# Patient Record
Sex: Female | Born: 1975 | Race: White | Hispanic: No | Smoking: Never smoker
Health system: Southern US, Community
[De-identification: ages and names within clinical notes are randomized; demographics above are authoritative.]

## PROBLEM LIST (undated history)

## (undated) DIAGNOSIS — F419 Anxiety disorder, unspecified: Secondary | ICD-10-CM

## (undated) HISTORY — DX: Anxiety disorder, unspecified: F41.9

---

## 2009-08-15 ENCOUNTER — Ambulatory Visit (HOSPITAL_COMMUNITY): Admission: RE | Admit: 2009-08-15 | Discharge: 2009-08-15 | Payer: Self-pay | Admitting: Obstetrics and Gynecology

## 2009-08-29 ENCOUNTER — Inpatient Hospital Stay (HOSPITAL_COMMUNITY): Admission: AD | Admit: 2009-08-29 | Discharge: 2009-09-01 | Payer: Self-pay | Admitting: Obstetrics and Gynecology

## 2010-12-24 ENCOUNTER — Encounter: Payer: Self-pay | Admitting: Obstetrics and Gynecology

## 2011-03-08 LAB — CBC
HCT: 40.2 % (ref 36.0–46.0)
Hemoglobin: 11.5 g/dL — ABNORMAL LOW (ref 12.0–15.0)
MCHC: 34.1 g/dL (ref 30.0–36.0)
MCHC: 34.2 g/dL (ref 30.0–36.0)
MCV: 94.4 fL (ref 78.0–100.0)
MCV: 96.5 fL (ref 78.0–100.0)
Platelets: 265 10*3/uL (ref 150–400)
RBC: 3.49 MIL/uL — ABNORMAL LOW (ref 3.87–5.11)
RDW: 13.5 % (ref 11.5–15.5)

## 2011-03-08 LAB — HERPES SIMPLEX VIRUS CULTURE: Culture: NOT DETECTED

## 2013-09-10 ENCOUNTER — Other Ambulatory Visit: Payer: Self-pay | Admitting: Obstetrics and Gynecology

## 2013-09-10 DIAGNOSIS — N6320 Unspecified lump in the left breast, unspecified quadrant: Secondary | ICD-10-CM

## 2013-09-23 ENCOUNTER — Ambulatory Visit
Admission: RE | Admit: 2013-09-23 | Discharge: 2013-09-23 | Disposition: A | Discharge status: Home / Self Care | Payer: 59 | Source: Ambulatory Visit | Attending: Obstetrics and Gynecology | Admitting: Obstetrics and Gynecology

## 2013-09-23 DIAGNOSIS — N6320 Unspecified lump in the left breast, unspecified quadrant: Secondary | ICD-10-CM

## 2016-01-24 DIAGNOSIS — M79671 Pain in right foot: Secondary | ICD-10-CM | POA: Diagnosis not present

## 2016-01-24 DIAGNOSIS — M79672 Pain in left foot: Secondary | ICD-10-CM | POA: Diagnosis not present

## 2016-01-24 DIAGNOSIS — M722 Plantar fascial fibromatosis: Secondary | ICD-10-CM | POA: Diagnosis not present

## 2016-06-20 DIAGNOSIS — D225 Melanocytic nevi of trunk: Secondary | ICD-10-CM | POA: Diagnosis not present

## 2016-06-20 DIAGNOSIS — L820 Inflamed seborrheic keratosis: Secondary | ICD-10-CM | POA: Diagnosis not present

## 2016-06-20 DIAGNOSIS — Z1283 Encounter for screening for malignant neoplasm of skin: Secondary | ICD-10-CM | POA: Diagnosis not present

## 2016-06-20 DIAGNOSIS — D485 Neoplasm of uncertain behavior of skin: Secondary | ICD-10-CM | POA: Diagnosis not present

## 2016-06-20 DIAGNOSIS — D2239 Melanocytic nevi of other parts of face: Secondary | ICD-10-CM | POA: Diagnosis not present

## 2017-01-06 DIAGNOSIS — Z01419 Encounter for gynecological examination (general) (routine) without abnormal findings: Secondary | ICD-10-CM | POA: Diagnosis not present

## 2017-01-06 DIAGNOSIS — Z1389 Encounter for screening for other disorder: Secondary | ICD-10-CM | POA: Diagnosis not present

## 2017-01-06 DIAGNOSIS — Z130 Encounter for screening for diseases of the blood and blood-forming organs and certain disorders involving the immune mechanism: Secondary | ICD-10-CM | POA: Diagnosis not present

## 2017-01-06 DIAGNOSIS — D649 Anemia, unspecified: Secondary | ICD-10-CM | POA: Diagnosis not present

## 2017-01-06 DIAGNOSIS — Z124 Encounter for screening for malignant neoplasm of cervix: Secondary | ICD-10-CM | POA: Diagnosis not present

## 2017-01-13 ENCOUNTER — Encounter: Payer: Self-pay | Admitting: Family Medicine

## 2017-01-13 ENCOUNTER — Ambulatory Visit (INDEPENDENT_AMBULATORY_CARE_PROVIDER_SITE_OTHER): Payer: 59 | Admitting: Family Medicine

## 2017-01-13 VITALS — BP 114/72 | HR 60 | Temp 97.8°F | Ht 60.63 in | Wt 146.2 lb

## 2017-01-13 DIAGNOSIS — Z13220 Encounter for screening for lipoid disorders: Secondary | ICD-10-CM | POA: Diagnosis not present

## 2017-01-13 DIAGNOSIS — Z23 Encounter for immunization: Secondary | ICD-10-CM

## 2017-01-13 DIAGNOSIS — R059 Cough, unspecified: Secondary | ICD-10-CM

## 2017-01-13 DIAGNOSIS — Z0000 Encounter for general adult medical examination without abnormal findings: Secondary | ICD-10-CM

## 2017-01-13 DIAGNOSIS — R635 Abnormal weight gain: Secondary | ICD-10-CM

## 2017-01-13 DIAGNOSIS — R05 Cough: Secondary | ICD-10-CM

## 2017-01-13 LAB — COMPREHENSIVE METABOLIC PANEL
ALT: 13 U/L (ref 0–35)
AST: 16 U/L (ref 0–37)
Albumin: 3.6 g/dL (ref 3.5–5.2)
Alkaline Phosphatase: 75 U/L (ref 39–117)
BUN: 13 mg/dL (ref 6–23)
CALCIUM: 9.2 mg/dL (ref 8.4–10.5)
CHLORIDE: 109 meq/L (ref 96–112)
CO2: 24 meq/L (ref 19–32)
CREATININE: 0.65 mg/dL (ref 0.40–1.20)
GFR: 108.06 mL/min (ref >60.00)
Glucose, Bld: 93 mg/dL (ref 70–99)
Potassium: 4.7 mEq/L (ref 3.5–5.1)
SODIUM: 137 meq/L (ref 135–145)
Total Bilirubin: 0.3 mg/dL (ref 0.2–1.2)
Total Protein: 6.6 g/dL (ref 6.0–8.3)

## 2017-01-13 LAB — LIPID PANEL
CHOLESTEROL: 225 mg/dL — AB (ref 0–200)
HDL: 56.3 mg/dL (ref >39.00)
LDL CALC: 139 mg/dL — AB (ref 0–99)
NonHDL: 168.8
Total CHOL/HDL Ratio: 4
Triglycerides: 147 mg/dL (ref 0.0–149.0)
VLDL: 29.4 mg/dL (ref 0.0–40.0)

## 2017-01-13 LAB — TSH: TSH: 0.83 u[IU]/mL (ref 0.35–4.50)

## 2017-01-13 NOTE — Progress Notes (Signed)
Subjective:    Patient ID: Anna Vang, female    DOB: 1975/12/24, 41 y.o.   MRN: [id number]  HPI This is a 41 yo female who presents today to establish care. Sees Dr. Daiva Huge, gyn. Is married and has 2 kids. Works as a Probation officer. Has two cats and a dog. Walks for exercise. Likes to be outdoors. Children are 7,13.   Has noticed that she coughs when she is around her cats or if the air conditioner turns on or if humidity changes. Coughs a couple of times and then stops. No sputum. Cough resolves with water and cough lozenge.   Has noticed some recent weight gain, increased stress with husband's diabetes.   Pap- last week Tdap- when 42 yo, will have today Flu- annual  Eye- over due Dental- regular, Dr. Scarlett Presto Exercise- walks    Past Medical History:  Diagnosis Date  . Anxiety    Past Surgical History:  Procedure Laterality Date  . CESAREAN SECTION     Family History  Problem Relation Age of Onset  . Mental illness Mother   . Kidney disease Mother   . Hypertension Father   . Diabetes Father   . Hypertension Brother    Social History  Substance Use Topics  . Smoking status: Never Smoker  . Smokeless tobacco: Never Used  . Alcohol use 1.2 oz/week    2 Glasses of wine per week      Review of Systems  Constitutional: Negative for fatigue.  HENT: Positive for congestion (occasional).   Respiratory: Positive for cough. Negative for shortness of breath and wheezing.   Cardiovascular: Positive for leg swelling (occaional at end of work day). Negative for chest pain.  Gastrointestinal: Negative for abdominal pain, constipation, diarrhea and nausea.  Genitourinary: Negative for dyspareunia, dysuria, frequency, hematuria and menstrual problem.  Musculoskeletal: Negative for back pain and neck pain.  Neurological: Negative for headaches.  Psychiatric/Behavioral: Negative for dysphoric mood and sleep disturbance.       Objective:   Physical Exam Physical Exam    Constitutional: Oriented to person, place, and time. She appears well-developed and well-nourished.  HENT:  Head: Normocephalic and atraumatic.  Eyes: Conjunctivae are normal.  Neck: Normal range of motion. Neck supple.  Cardiovascular: Normal rate, regular rhythm and normal heart sounds.   Pulmonary/Chest: Effort normal and breath sounds normal.  Musculoskeletal: Normal range of motion.  Neurological: Alert and oriented to person, place, and time.  Skin: Skin is warm and dry.  Psychiatric: Normal mood and affect. Behavior is normal. Judgment and thought content normal.  Vitals reviewed.     BP 114/72 (BP Location: Left Arm, Patient Position: Sitting, Cuff Size: Normal)   Pulse 60   Temp 97.8 F (36.6 C) (Oral)   Ht 5' 0.63" (1.54 m)   Wt 146 lb 3.2 oz (66.3 kg)   LMP 12/28/2016   SpO2 99%   BMI 27.96 kg/m  Wt Readings from Last 3 Encounters:  01/13/17 146 lb 3.2 oz (66.3 kg)   Depression screen PHQ 2/9 01/13/2017  Decreased Interest 0  Down, Depressed, Hopeless 0  PHQ - 2 Score 0       Assessment & Plan:  1. Encounter for medical examination to establish care - Discussed and encouraged healthy lifestyle choices- adequate sleep, regular exercise, stress management and healthy food choices.   2. Cough - likely due to allergic rhinitis, suggested OTC long acting antihistamine, may need inhaled corticosteroid/albuterol if no improvement, she will let me  know - Comprehensive metabolic panel  3. Weight gain - Comprehensive metabolic panel - TSH - Lipid panel  4. Screening for lipid disorders - Lipid panel  5. Flu vaccine need - Flu Vaccine QUAD 36+ mos IM  6. Need for diphtheria-tetanus-pertussis (Tdap) vaccine - Tdap vaccine greater than or equal to 7yo IM  - follow up based on labs  Clarene Reamer, FNP-BC  [facility]  01/13/2017 4:39 PM

## 2017-01-13 NOTE — Progress Notes (Signed)
Pre visit review using our clinic review tool, if applicable. No additional management support is needed unless otherwise documented below in the visit note. 

## 2017-01-13 NOTE — Patient Instructions (Addendum)
Try an over the counter long acting antihistamine like Zyrtec or Claritin (generic is fine)  Please schedule an eye appointment

## 2017-03-06 ENCOUNTER — Ambulatory Visit (INDEPENDENT_AMBULATORY_CARE_PROVIDER_SITE_OTHER): Payer: 59 | Admitting: Physician Assistant

## 2017-03-06 ENCOUNTER — Encounter: Payer: Self-pay | Admitting: Physician Assistant

## 2017-03-06 VITALS — BP 110/78 | HR 69 | Temp 98.9°F | Ht 62.0 in | Wt 145.2 lb

## 2017-03-06 DIAGNOSIS — L03211 Cellulitis of face: Secondary | ICD-10-CM | POA: Diagnosis not present

## 2017-03-06 MED ORDER — AMOXICILLIN-POT CLAVULANATE 875-125 MG PO TABS: 1.0000 | ORAL_TABLET | Freq: Two times a day (BID) | ORAL | 0 refills | Status: AC

## 2017-03-06 MED ORDER — MUPIROCIN 2 % EX OINT: 1.0000 "application " | TOPICAL_OINTMENT | Freq: Two times a day (BID) | CUTANEOUS | 0 refills | Status: DC

## 2017-03-06 NOTE — Progress Notes (Signed)
Anna Vang is a 41 y.o. female here for a new problem.  History of Present Illness:   Chief Complaint  Patient presents with  . Rash    Bumps under left eye, red and tender to touch    HPI   On Friday, patient noticed a rash under her L eye. It started as one or two bumps but has become a little more tender with more bumps. No drainage, no painful EOM, no fevers, no changes in vision, does not wear contacts. She has tried topical salicylic acid, hydrocortisone cream and yeast infection cream without relief.  She does state that she has two cats at home and one of them is outdoor, she says that its possible that she has gotten a secondary infection from this.  PMHx, SurgHx, SocialHx, Medications, and Allergies were reviewed in the Visit Navigator and updated as appropriate.  Current Medications:   Current Outpatient Prescriptions:  .  folic acid (FOLVITE) 676 MCG tablet, Take 400 mcg by mouth daily., Disp: , Rfl:  .  Multiple Vitamins-Minerals (MULTIVITAMIN WITH MINERALS) tablet, Take 1 tablet by mouth daily., Disp: , Rfl:  .  amoxicillin-clavulanate (AUGMENTIN) 875-125 MG tablet, Take 1 tablet by mouth 2 (two) times daily., Disp: 20 tablet, Rfl: 0 .  mupirocin ointment (BACTROBAN) 2 %, Apply 1 application topically 2 (two) times daily., Disp: 22 g, Rfl: 0   Review of Systems:   Review of Systems  Constitutional: Negative for chills and fever.  Eyes: Positive for redness.       Bumps under left eye, red raised area and tender to touch  Skin: Positive for rash.       Under left eye    Vitals:   Vitals:   03/06/17 0855  BP: 110/78  Pulse: 69  Temp: 98.9 F (37.2 C)  TempSrc: Oral  SpO2: 98%  Weight: 145 lb 4 oz (65.9 kg)  Height: 5\' 2"  (1.575 m)     Body mass index is 26.57 kg/m.  Physical Exam:   Physical Exam  Constitutional: She appears well-developed. She is cooperative.  Non-toxic appearance. She does not have a sickly appearance. She does not appear ill.  No distress.  Eyes: Right eye exhibits normal extraocular motion. Left eye exhibits normal extraocular motion.  Numerous pustules under L eye with redness and swelling, no active drainage, tender to touch  Cardiovascular: Normal rate, regular rhythm, S1 normal, S2 normal, normal heart sounds and normal pulses.   No LE edema  Pulmonary/Chest: Effort normal and breath sounds normal.  Neurological: She is alert.  Nursing note and vitals reviewed.    Assessment and Plan:    Anna Vang was seen today for rash.  Diagnoses and all orders for this visit:  Cellulitis of face  Other orders -     amoxicillin-clavulanate (AUGMENTIN) 875-125 MG tablet; Take 1 tablet by mouth 2 (two) times daily. -     mupirocin ointment (BACTROBAN) 2 %; Apply 1 application topically 2 (two) times daily.    Patient also evaluated by Dr. Briscoe Deutscher. Suspect patient with likely secondary skin infection, will treat with Augmentin per orders. Topical Bactroban to area. Avoid further use of Salicylic Acid as this will possibly cause worsening irritation. Advised patient as follows: If you develop fevers, chills, worsening redness or other concerning symptoms, please seek medical attention.  . Reviewed expectations re: course of current medical issues. . Discussed self-management of symptoms. . Outlined signs and symptoms indicating need for more acute intervention. . Patient  verbalized understanding and all questions were answered. . See orders for this visit as documented in the electronic medical record. . Patient received an After-Visit Summary.   Inda Coke, PA-C

## 2017-03-06 NOTE — Patient Instructions (Signed)
It was great seeing you today!  Begin the oral antibiotic today, you may take with food as tolerated. You may apply Mupirocin to area. Avoid salicylic acid.  If you develop fevers, chills, worsening redness or other concerning symptoms, please seek medical attention.

## 2017-03-06 NOTE — Progress Notes (Signed)
Pre visit review using our clinic review tool, if applicable. No additional management support is needed unless otherwise documented below in the visit note. 

## 2017-04-29 ENCOUNTER — Telehealth: Payer: Self-pay

## 2017-04-29 NOTE — Telephone Encounter (Signed)
FYI: Spoke with patient, she no longer needs the script sent to the pharmacy due to being home now. She used medication last pm with some relief. Used cold compresses this morning with some relief as well. If sx are not better in 1 week she will call back.

## 2017-04-29 NOTE — Telephone Encounter (Signed)
Tried calling patient on Mobile phone with no answer and unable to leave a voicemail. FYI.

## 2017-04-29 NOTE — Telephone Encounter (Signed)
Please call patient on cell

## 2017-04-29 NOTE — Telephone Encounter (Signed)
Verbal okay from [facility] to send in medication. Due to the phone system I am unable to call this patient. If she happens to call office and get through then we need too obtain her pharmacy information if still needing medication.

## 2017-04-29 NOTE — Telephone Encounter (Signed)
[place] [facility] [place] Patient Name: Anna Vang Gender: Female DOB: 1976-03-31 Age: 41 Y 11 M 2 D Return Phone Number: [phone] (Primary), [phone] (Secondary) City/State/Zip: [place] Client [place] [facility] [place] Client Site [place] [facility] [place] Night Who Is Calling Patient / Member / Family / Caregiver Call Type Triage / Clinical Relationship To Patient Self Return Phone Number [phone] (Primary) Chief Complaint Skin Lesion - Moles/ Lumps/ Growths Reason for Call Symptomatic / Request for [place] states was seen for skin infection underneath eye. She's at [facility] in [place] and she has it again. Was prescribed antibiotic and antibiotic cream, but she left cream at home. They are tiny bumps with white heads. Requesting cream [facility] Dr unknown Appointment Disposition EMR Appointment Not Necessary Info pasted into Epic No Nurse Assessment Nurse: Emilio Math, RN, Estill Bamberg Date/Time Eilene Ghazi Time): 04/26/2017 8:45:48 AM Confirm and document reason for call. If symptomatic, describe symptoms. ---Caller states she was prescribed an antibiotic cream x1 month ago for infection to eye, resolved, but now is back. Small bumps with "white heads" underneath eye, R eye "puffy". Denies fever. Out of town and wants medication called in. Does the PT have any chronic conditions? (i.e. diabetes, asthma, etc.) ---No Is the patient pregnant or possibly pregnant? (Ask all females between the ages of 70-55) ---No Guidelines Guideline Title Affirmed Question Eye - Swelling MODERATE-SEVERE eyelid swelling on one side (Exception: due to a mosquito bite) Disp. Time Eilene Ghazi Time) Disposition Final User 04/26/2017 8:51:03 AM See Physician within 24 Hours Yes Emilio Math, RN, Estill Bamberg Referrals GO TO FACILITY UNDECIDED Care Advice  Given Per Guideline SEE PHYSICIAN WITHIN 24 HOURS: * IF OFFICE WILL BE OPEN: You need to be seen within the next 24 hours. Call your doctor when the office opens, and make an appointment. LOCAL COLD: Apply a cool, wet washcloth to the area for 20 minutes. CALL BACK IF: * Fever occurs * You become worse. CARE ADVICE given per Eye - Swelling (Adult) guideline. PLEASE NOTE:

## 2017-05-22 ENCOUNTER — Telehealth: Payer: Self-pay | Admitting: Emergency Medicine

## 2017-05-22 NOTE — Telephone Encounter (Signed)
Called and spoke with patient, she states that she came home and applied the medication that she previously was prescribed and it went away. She still has one bump that she will monitor. Patient states she will make appointment if needed.      PLEASE NOTE: All timestamps contained within this report are represented as [place] Standard Time. CONFIDENTIALTY NOTICE: This fax transmission is intended only for the addressee. It contains information that is legally privileged, confidential or otherwise protected from use or disclosure. If you are not the intended recipient, you are strictly prohibited from reviewing, disclosing, copying using or disseminating any of this information or taking any action in reliance on or regarding this information. If you have received this fax in error, please notify us immediately by telephone so that we can arrange for its return to [place]. Phone: [phone], Toll-Free: [phone], Fax: [phone] Page: 1 of 2 Call Id: [id number] [place] [facility] [place] Patient Name: Anna Vang Gender: Female DOB: Jul 15, 1976 Age: 41 Y 11 M 28 D Return Phone Number: [phone] (Primary), [phone] (Secondary) City/State/Zip: [place] Client [place] [facility] [place] Client Site [place] [facility] [place] Night Who Is Calling Patient / Member / Family / Caregiver Call Type Triage / Clinical Relationship To Patient Self Return Phone Number [phone] (Primary) Chief Complaint Skin Lesion - Moles/ Lumps/ Growths Reason for Call Symptomatic / Request for [place] states was seen for skin infection underneath eye. She's at [facility] in [place] and she has it again. Was prescribed antibiotic and antibiotic cream, but she left cream at home. They are tiny bumps with white heads. [facility]  unknown Appointment Disposition EMR Appointment Not Necessary Info pasted into Epic No Nurse Assessment Nurse: Emilio Math, RN, Estill Bamberg Date/Time Eilene Ghazi Time): 04/26/2017 8:45:48 AM Confirm and document reason for call. If symptomatic, describe symptoms. ---Caller states she was prescribed an antibiotic cream x1 month ago for infection to eye, resolved, but now is back. Small bumps with "white heads" underneath eye, R eye "puffy". Denies fever. Out of town and wants medication called in. Does the PT have any chronic conditions? (i.e. diabetes, asthma, etc.) ---No Is the patient pregnant or possibly pregnant? (Ask all females between the ages of 20-55) ---No Guidelines Guideline Title Affirmed Question Eye - Swelling MODERATE-SEVERE eyelid swelling on one side (Exception: due to a mosquito bite) Disp. Time Eilene Ghazi Time) Disposition Final User 04/26/2017 8:51:03 AM See Physician within 24 Hours Yes Emilio Math, RN, Estill Bamberg Referrals GO TO FACILITY UNDECIDED Care Advice Given Per Guideline SEE PHYSICIAN WITHIN 24 HOURS: * IF OFFICE WILL BE OPEN: You need to be seen within the next 24 hours. Call your doctor when the office opens, and make an appointment. LOCAL COLD: Apply a cool, wet washcloth to the area for 20 minutes. CALL BACK IF: * Fever occurs * You become worse. CARE ADVICE given per Eye - Swelling (Adult) guideline. PLEASE NOTE: All timestamps contained within this report are represented as [place] Standard Time. CONFIDENTIALTY NOTICE: This fax transmission is intended only for the addressee. It contains information that is legally privileged, confidential or otherwise protected from use or disclosure. If you are not the intended recipient, you are strictly prohibited from reviewing, disclosing, copying using or disseminating any of this information or taking any action in reliance on or regarding this information. If you have received this fax  in error, please notify us immediately by  telephone so that we can arrange for its return to [place]. Phone: [phone], Toll-Free: [phone], Fax: [phone] Page: 2 of 2 Call Id: [id number] Comments User: Richardean Sale, RN Date/Time Eilene Ghazi Time): 04/26/2017 8:51:33 AM Caller is out of town, will be seen at nearby Urgent Care.

## 2018-05-21 ENCOUNTER — Ambulatory Visit: Payer: 59 | Admitting: Family Medicine

## 2018-05-21 DIAGNOSIS — Z0289 Encounter for other administrative examinations: Secondary | ICD-10-CM

## 2018-05-21 DIAGNOSIS — H5203 Hypermetropia, bilateral: Secondary | ICD-10-CM | POA: Diagnosis not present

## 2018-05-29 DIAGNOSIS — Z1389 Encounter for screening for other disorder: Secondary | ICD-10-CM | POA: Diagnosis not present

## 2018-05-29 DIAGNOSIS — Z01419 Encounter for gynecological examination (general) (routine) without abnormal findings: Secondary | ICD-10-CM | POA: Diagnosis not present

## 2018-05-29 DIAGNOSIS — Z124 Encounter for screening for malignant neoplasm of cervix: Secondary | ICD-10-CM | POA: Diagnosis not present

## 2018-05-29 DIAGNOSIS — Z130 Encounter for screening for diseases of the blood and blood-forming organs and certain disorders involving the immune mechanism: Secondary | ICD-10-CM | POA: Diagnosis not present

## 2019-06-11 DIAGNOSIS — Z124 Encounter for screening for malignant neoplasm of cervix: Secondary | ICD-10-CM | POA: Diagnosis not present

## 2019-06-11 DIAGNOSIS — Z01419 Encounter for gynecological examination (general) (routine) without abnormal findings: Secondary | ICD-10-CM | POA: Diagnosis not present

## 2019-06-11 DIAGNOSIS — Z1231 Encounter for screening mammogram for malignant neoplasm of breast: Secondary | ICD-10-CM | POA: Diagnosis not present

## 2019-06-11 DIAGNOSIS — Z6828 Body mass index (BMI) 28.0-28.9, adult: Secondary | ICD-10-CM | POA: Diagnosis not present

## 2019-06-11 DIAGNOSIS — Z1389 Encounter for screening for other disorder: Secondary | ICD-10-CM | POA: Diagnosis not present

## 2019-06-11 DIAGNOSIS — Z130 Encounter for screening for diseases of the blood and blood-forming organs and certain disorders involving the immune mechanism: Secondary | ICD-10-CM | POA: Diagnosis not present

## 2019-06-11 DIAGNOSIS — D473 Essential (hemorrhagic) thrombocythemia: Secondary | ICD-10-CM | POA: Diagnosis not present

## 2019-09-13 DIAGNOSIS — L814 Other melanin hyperpigmentation: Secondary | ICD-10-CM | POA: Diagnosis not present

## 2019-09-13 DIAGNOSIS — D229 Melanocytic nevi, unspecified: Secondary | ICD-10-CM | POA: Diagnosis not present

## 2020-03-20 DIAGNOSIS — Z20828 Contact with and (suspected) exposure to other viral communicable diseases: Secondary | ICD-10-CM | POA: Diagnosis not present

## 2020-03-20 DIAGNOSIS — Z20822 Contact with and (suspected) exposure to covid-19: Secondary | ICD-10-CM | POA: Diagnosis not present

## 2020-03-20 DIAGNOSIS — Z03818 Encounter for observation for suspected exposure to other biological agents ruled out: Secondary | ICD-10-CM | POA: Diagnosis not present

## 2020-03-20 DIAGNOSIS — J0140 Acute pansinusitis, unspecified: Secondary | ICD-10-CM | POA: Diagnosis not present

## 2021-04-25 ENCOUNTER — Encounter (HOSPITAL_COMMUNITY): Payer: Self-pay | Admitting: Cardiology

## 2021-04-25 ENCOUNTER — Emergency Department (HOSPITAL_COMMUNITY): Payer: 59

## 2021-04-25 ENCOUNTER — Emergency Department (HOSPITAL_COMMUNITY): Payer: 59 | Admitting: Anesthesiology

## 2021-04-25 ENCOUNTER — Inpatient Hospital Stay (HOSPITAL_COMMUNITY): Payer: 59

## 2021-04-25 ENCOUNTER — Inpatient Hospital Stay (HOSPITAL_COMMUNITY)
Admission: EM | Admit: 2021-04-25 | Discharge: 2021-04-29 | DRG: 232 | Disposition: A | Discharge status: Home / Self Care | Payer: 59 | Attending: Cardiothoracic Surgery | Admitting: Cardiothoracic Surgery

## 2021-04-25 ENCOUNTER — Inpatient Hospital Stay (HOSPITAL_COMMUNITY)
Admission: EM | Disposition: A | Discharge status: Home / Self Care | Payer: Self-pay | Source: Home / Self Care | Attending: Cardiothoracic Surgery

## 2021-04-25 ENCOUNTER — Ambulatory Visit (HOSPITAL_COMMUNITY): Admit: 2021-04-25 | Payer: 59 | Admitting: Cardiovascular Disease

## 2021-04-25 ENCOUNTER — Encounter (HOSPITAL_COMMUNITY)
Admission: EM | Disposition: A | Discharge status: Home / Self Care | Payer: Self-pay | Source: Home / Self Care | Attending: Cardiothoracic Surgery

## 2021-04-25 DIAGNOSIS — E8770 Fluid overload, unspecified: Secondary | ICD-10-CM | POA: Diagnosis not present

## 2021-04-25 DIAGNOSIS — R0789 Other chest pain: Secondary | ICD-10-CM | POA: Diagnosis not present

## 2021-04-25 DIAGNOSIS — I2542 Coronary artery dissection: Secondary | ICD-10-CM | POA: Diagnosis not present

## 2021-04-25 DIAGNOSIS — E876 Hypokalemia: Secondary | ICD-10-CM | POA: Diagnosis present

## 2021-04-25 DIAGNOSIS — I2111 ST elevation (STEMI) myocardial infarction involving right coronary artery: Secondary | ICD-10-CM | POA: Diagnosis not present

## 2021-04-25 DIAGNOSIS — D62 Acute posthemorrhagic anemia: Secondary | ICD-10-CM | POA: Diagnosis not present

## 2021-04-25 DIAGNOSIS — Z4682 Encounter for fitting and adjustment of non-vascular catheter: Secondary | ICD-10-CM | POA: Diagnosis not present

## 2021-04-25 DIAGNOSIS — J90 Pleural effusion, not elsewhere classified: Secondary | ICD-10-CM | POA: Diagnosis not present

## 2021-04-25 DIAGNOSIS — Z8249 Family history of ischemic heart disease and other diseases of the circulatory system: Secondary | ICD-10-CM | POA: Diagnosis not present

## 2021-04-25 DIAGNOSIS — I083 Combined rheumatic disorders of mitral, aortic and tricuspid valves: Secondary | ICD-10-CM | POA: Diagnosis not present

## 2021-04-25 DIAGNOSIS — F419 Anxiety disorder, unspecified: Secondary | ICD-10-CM | POA: Diagnosis not present

## 2021-04-25 DIAGNOSIS — R231 Pallor: Secondary | ICD-10-CM | POA: Diagnosis not present

## 2021-04-25 DIAGNOSIS — Z951 Presence of aortocoronary bypass graft: Secondary | ICD-10-CM | POA: Diagnosis not present

## 2021-04-25 DIAGNOSIS — I517 Cardiomegaly: Secondary | ICD-10-CM | POA: Diagnosis not present

## 2021-04-25 DIAGNOSIS — R42 Dizziness and giddiness: Secondary | ICD-10-CM | POA: Diagnosis not present

## 2021-04-25 DIAGNOSIS — I2510 Atherosclerotic heart disease of native coronary artery without angina pectoris: Secondary | ICD-10-CM

## 2021-04-25 DIAGNOSIS — Z9889 Other specified postprocedural states: Secondary | ICD-10-CM

## 2021-04-25 DIAGNOSIS — R0989 Other specified symptoms and signs involving the circulatory and respiratory systems: Secondary | ICD-10-CM | POA: Diagnosis not present

## 2021-04-25 DIAGNOSIS — I213 ST elevation (STEMI) myocardial infarction of unspecified site: Secondary | ICD-10-CM | POA: Diagnosis not present

## 2021-04-25 DIAGNOSIS — J811 Chronic pulmonary edema: Secondary | ICD-10-CM | POA: Diagnosis not present

## 2021-04-25 DIAGNOSIS — J9811 Atelectasis: Secondary | ICD-10-CM | POA: Diagnosis not present

## 2021-04-25 DIAGNOSIS — Z20822 Contact with and (suspected) exposure to covid-19: Secondary | ICD-10-CM | POA: Diagnosis present

## 2021-04-25 DIAGNOSIS — R079 Chest pain, unspecified: Secondary | ICD-10-CM | POA: Diagnosis not present

## 2021-04-25 HISTORY — PX: ENDOVEIN HARVEST OF GREATER SAPHENOUS VEIN: SHX5059

## 2021-04-25 HISTORY — PX: LEFT HEART CATH AND CORONARY ANGIOGRAPHY: CATH118249

## 2021-04-25 HISTORY — PX: TEE WITHOUT CARDIOVERSION: SHX5443

## 2021-04-25 HISTORY — PX: CORONARY ARTERY BYPASS GRAFT: SHX141

## 2021-04-25 HISTORY — PX: CORONARY/GRAFT ACUTE MI REVASCULARIZATION: CATH118305

## 2021-04-25 HISTORY — PX: CORONARY ANGIOGRAPHY: CATH118303

## 2021-04-25 LAB — COMPREHENSIVE METABOLIC PANEL
ALT: 24 U/L (ref 0–44)
AST: 26 U/L (ref 15–41)
Albumin: 2.8 g/dL — ABNORMAL LOW (ref 3.5–5.0)
Alkaline Phosphatase: 63 U/L (ref 38–126)
Anion gap: 9 (ref 5–15)
BUN: 14 mg/dL (ref 6–20)
CO2: 19 mmol/L — ABNORMAL LOW (ref 22–32)
Calcium: 8 mg/dL — ABNORMAL LOW (ref 8.9–10.3)
Chloride: 106 mmol/L (ref 98–111)
Creatinine, Ser: 0.66 mg/dL (ref 0.44–1.00)
GFR, Estimated: >60 mL/min (ref >60)
Glucose, Bld: 136 mg/dL — ABNORMAL HIGH (ref 70–99)
Potassium: 2.7 mmol/L — CL (ref 3.5–5.1)
Sodium: 134 mmol/L — ABNORMAL LOW (ref 135–145)
Total Bilirubin: 0.8 mg/dL (ref 0.3–1.2)
Total Protein: 5.8 g/dL — ABNORMAL LOW (ref 6.5–8.1)

## 2021-04-25 LAB — TROPONIN I (HIGH SENSITIVITY): Troponin I (High Sensitivity): 81 ng/L — ABNORMAL HIGH (ref <18)

## 2021-04-25 LAB — POCT I-STAT EG7
Acid-base deficit: 6 mmol/L — ABNORMAL HIGH (ref 0.0–2.0)
Bicarbonate: 20.3 mmol/L (ref 20.0–28.0)
Calcium, Ion: 0.97 mmol/L — ABNORMAL LOW (ref 1.15–1.40)
HCT: 30 % — ABNORMAL LOW (ref 36.0–46.0)
Hemoglobin: 10.2 g/dL — ABNORMAL LOW (ref 12.0–15.0)
O2 Saturation: 79 %
Potassium: 5 mmol/L (ref 3.5–5.1)
Sodium: 139 mmol/L (ref 135–145)
TCO2: 22 mmol/L (ref 22–32)
pCO2, Ven: 44.4 mmHg (ref 44.0–60.0)
pH, Ven: 7.267 (ref 7.250–7.430)
pO2, Ven: 49 mmHg — ABNORMAL HIGH (ref 32.0–45.0)

## 2021-04-25 LAB — POCT I-STAT, CHEM 8
BUN: 15 mg/dL (ref 6–20)
BUN: 13 mg/dL (ref 6–20)
BUN: 11 mg/dL (ref 6–20)
BUN: 11 mg/dL (ref 6–20)
Calcium, Ion: 1.2 mmol/L (ref 1.15–1.40)
Calcium, Ion: 1.13 mmol/L — ABNORMAL LOW (ref 1.15–1.40)
Calcium, Ion: 0.94 mmol/L — ABNORMAL LOW (ref 1.15–1.40)
Calcium, Ion: 0.91 mmol/L — ABNORMAL LOW (ref 1.15–1.40)
Chloride: 106 mmol/L (ref 98–111)
Chloride: 106 mmol/L (ref 98–111)
Chloride: 106 mmol/L (ref 98–111)
Chloride: 104 mmol/L (ref 98–111)
Creatinine, Ser: 0.5 mg/dL (ref 0.44–1.00)
Creatinine, Ser: 0.4 mg/dL — ABNORMAL LOW (ref 0.44–1.00)
Creatinine, Ser: 0.3 mg/dL — ABNORMAL LOW (ref 0.44–1.00)
Creatinine, Ser: 0.4 mg/dL — ABNORMAL LOW (ref 0.44–1.00)
Glucose, Bld: 137 mg/dL — ABNORMAL HIGH (ref 70–99)
Glucose, Bld: 127 mg/dL — ABNORMAL HIGH (ref 70–99)
Glucose, Bld: 156 mg/dL — ABNORMAL HIGH (ref 70–99)
Glucose, Bld: 171 mg/dL — ABNORMAL HIGH (ref 70–99)
HCT: 40 % (ref 36.0–46.0)
HCT: 40 % (ref 36.0–46.0)
HCT: 32 % — ABNORMAL LOW (ref 36.0–46.0)
HCT: 27 % — ABNORMAL LOW (ref 36.0–46.0)
Hemoglobin: 13.6 g/dL (ref 12.0–15.0)
Hemoglobin: 13.6 g/dL (ref 12.0–15.0)
Hemoglobin: 10.9 g/dL — ABNORMAL LOW (ref 12.0–15.0)
Hemoglobin: 9.2 g/dL — ABNORMAL LOW (ref 12.0–15.0)
Potassium: 2.9 mmol/L — ABNORMAL LOW (ref 3.5–5.1)
Potassium: 3.8 mmol/L (ref 3.5–5.1)
Potassium: 5.1 mmol/L (ref 3.5–5.1)
Potassium: 4.8 mmol/L (ref 3.5–5.1)
Sodium: 139 mmol/L (ref 135–145)
Sodium: 139 mmol/L (ref 135–145)
Sodium: 138 mmol/L (ref 135–145)
Sodium: 138 mmol/L (ref 135–145)
TCO2: 21 mmol/L — ABNORMAL LOW (ref 22–32)
TCO2: 22 mmol/L (ref 22–32)
TCO2: 22 mmol/L (ref 22–32)
TCO2: 22 mmol/L (ref 22–32)

## 2021-04-25 LAB — POCT I-STAT 7, (LYTES, BLD GAS, ICA,H+H)
Acid-base deficit: 5 mmol/L — ABNORMAL HIGH (ref 0.0–2.0)
Acid-base deficit: 5 mmol/L — ABNORMAL HIGH (ref 0.0–2.0)
Acid-base deficit: 3 mmol/L — ABNORMAL HIGH (ref 0.0–2.0)
Acid-base deficit: 4 mmol/L — ABNORMAL HIGH (ref 0.0–2.0)
Acid-base deficit: 7 mmol/L — ABNORMAL HIGH (ref 0.0–2.0)
Bicarbonate: 21.5 mmol/L (ref 20.0–28.0)
Bicarbonate: 20.6 mmol/L (ref 20.0–28.0)
Bicarbonate: 21.6 mmol/L (ref 20.0–28.0)
Bicarbonate: 22.7 mmol/L (ref 20.0–28.0)
Bicarbonate: 19.7 mmol/L — ABNORMAL LOW (ref 20.0–28.0)
Calcium, Ion: 1.1 mmol/L — ABNORMAL LOW (ref 1.15–1.40)
Calcium, Ion: 0.97 mmol/L — ABNORMAL LOW (ref 1.15–1.40)
Calcium, Ion: 0.92 mmol/L — ABNORMAL LOW (ref 1.15–1.40)
Calcium, Ion: 0.94 mmol/L — ABNORMAL LOW (ref 1.15–1.40)
Calcium, Ion: 1.01 mmol/L — ABNORMAL LOW (ref 1.15–1.40)
HCT: 40 % (ref 36.0–46.0)
HCT: 30 % — ABNORMAL LOW (ref 36.0–46.0)
HCT: 26 % — ABNORMAL LOW (ref 36.0–46.0)
HCT: 29 % — ABNORMAL LOW (ref 36.0–46.0)
HCT: 32 % — ABNORMAL LOW (ref 36.0–46.0)
Hemoglobin: 13.6 g/dL (ref 12.0–15.0)
Hemoglobin: 10.2 g/dL — ABNORMAL LOW (ref 12.0–15.0)
Hemoglobin: 8.8 g/dL — ABNORMAL LOW (ref 12.0–15.0)
Hemoglobin: 9.9 g/dL — ABNORMAL LOW (ref 12.0–15.0)
Hemoglobin: 10.9 g/dL — ABNORMAL LOW (ref 12.0–15.0)
O2 Saturation: 100 %
O2 Saturation: 100 %
O2 Saturation: 100 %
O2 Saturation: 100 %
O2 Saturation: 98 %
Patient temperature: 35.2
Potassium: 3.8 mmol/L (ref 3.5–5.1)
Potassium: 4.5 mmol/L (ref 3.5–5.1)
Potassium: 4.8 mmol/L (ref 3.5–5.1)
Potassium: 5.5 mmol/L — ABNORMAL HIGH (ref 3.5–5.1)
Potassium: 4.9 mmol/L (ref 3.5–5.1)
Sodium: 140 mmol/L (ref 135–145)
Sodium: 138 mmol/L (ref 135–145)
Sodium: 137 mmol/L (ref 135–145)
Sodium: 139 mmol/L (ref 135–145)
Sodium: 139 mmol/L (ref 135–145)
TCO2: 23 mmol/L (ref 22–32)
TCO2: 22 mmol/L (ref 22–32)
TCO2: 23 mmol/L (ref 22–32)
TCO2: 24 mmol/L (ref 22–32)
TCO2: 21 mmol/L — ABNORMAL LOW (ref 22–32)
pCO2 arterial: 45.5 mmHg (ref 32.0–48.0)
pCO2 arterial: 41.7 mmHg (ref 32.0–48.0)
pCO2 arterial: 41.8 mmHg (ref 32.0–48.0)
pCO2 arterial: 42.2 mmHg (ref 32.0–48.0)
pCO2 arterial: 38.2 mmHg (ref 32.0–48.0)
pH, Arterial: 7.282 — ABNORMAL LOW (ref 7.350–7.450)
pH, Arterial: 7.303 — ABNORMAL LOW (ref 7.350–7.450)
pH, Arterial: 7.317 — ABNORMAL LOW (ref 7.350–7.450)
pH, Arterial: 7.342 — ABNORMAL LOW (ref 7.350–7.450)
pH, Arterial: 7.31 — ABNORMAL LOW (ref 7.350–7.450)
pO2, Arterial: 448 mmHg — ABNORMAL HIGH (ref 83.0–108.0)
pO2, Arterial: 450 mmHg — ABNORMAL HIGH (ref 83.0–108.0)
pO2, Arterial: 198 mmHg — ABNORMAL HIGH (ref 83.0–108.0)
pO2, Arterial: 414 mmHg — ABNORMAL HIGH (ref 83.0–108.0)
pO2, Arterial: 101 mmHg (ref 83.0–108.0)

## 2021-04-25 LAB — CBC
HCT: 41.1 % (ref 36.0–46.0)
HCT: 33.3 % — ABNORMAL LOW (ref 36.0–46.0)
Hemoglobin: 13.8 g/dL (ref 12.0–15.0)
Hemoglobin: 11.1 g/dL — ABNORMAL LOW (ref 12.0–15.0)
MCH: 31.2 pg (ref 26.0–34.0)
MCH: 31.7 pg (ref 26.0–34.0)
MCHC: 33.6 g/dL (ref 30.0–36.0)
MCHC: 33.3 g/dL (ref 30.0–36.0)
MCV: 92.8 fL (ref 80.0–100.0)
MCV: 95.1 fL (ref 80.0–100.0)
Platelets: 344 10*3/uL (ref 150–400)
Platelets: 265 10*3/uL (ref 150–400)
RBC: 4.43 MIL/uL (ref 3.87–5.11)
RBC: 3.5 MIL/uL — ABNORMAL LOW (ref 3.87–5.11)
RDW: 12.2 % (ref 11.5–15.5)
RDW: 12.4 % (ref 11.5–15.5)
WBC: 17.3 10*3/uL — ABNORMAL HIGH (ref 4.0–10.5)
WBC: 23.8 10*3/uL — ABNORMAL HIGH (ref 4.0–10.5)
nRBC: 0 % (ref 0.0–0.2)
nRBC: 0 % (ref 0.0–0.2)

## 2021-04-25 LAB — GLUCOSE, CAPILLARY
Glucose-Capillary: 142 mg/dL — ABNORMAL HIGH (ref 70–99)
Glucose-Capillary: 130 mg/dL — ABNORMAL HIGH (ref 70–99)
Glucose-Capillary: 141 mg/dL — ABNORMAL HIGH (ref 70–99)
Glucose-Capillary: 153 mg/dL — ABNORMAL HIGH (ref 70–99)

## 2021-04-25 LAB — LIPID PANEL
Cholesterol: 265 mg/dL — ABNORMAL HIGH (ref 0–200)
HDL: 39 mg/dL — ABNORMAL LOW (ref >40)
LDL Cholesterol: UNDETERMINED mg/dL (ref 0–99)
Total CHOL/HDL Ratio: 6.8 RATIO
Triglycerides: 407 mg/dL — ABNORMAL HIGH (ref <150)
VLDL: UNDETERMINED mg/dL (ref 0–40)

## 2021-04-25 LAB — POCT ACTIVATED CLOTTING TIME
Activated Clotting Time: 220 seconds
Activated Clotting Time: 350 seconds

## 2021-04-25 LAB — HEMOGLOBIN AND HEMATOCRIT, BLOOD
HCT: 31.8 % — ABNORMAL LOW (ref 36.0–46.0)
Hemoglobin: 10.5 g/dL — ABNORMAL LOW (ref 12.0–15.0)

## 2021-04-25 LAB — LDL CHOLESTEROL, DIRECT: Direct LDL: 173.7 mg/dL — ABNORMAL HIGH (ref 0–99)

## 2021-04-25 LAB — RESP PANEL BY RT-PCR (FLU A&B, COVID) ARPGX2
Influenza A by PCR: NEGATIVE
Influenza B by PCR: NEGATIVE
SARS Coronavirus 2 by RT PCR: NEGATIVE

## 2021-04-25 LAB — APTT
aPTT: 26 seconds (ref 24–36)
aPTT: 26 seconds (ref 24–36)

## 2021-04-25 LAB — PROTIME-INR
INR: 0.9 (ref 0.8–1.2)
INR: 1.2 (ref 0.8–1.2)
Prothrombin Time: 12.2 seconds (ref 11.4–15.2)
Prothrombin Time: 15.6 seconds — ABNORMAL HIGH (ref 11.4–15.2)

## 2021-04-25 LAB — PREPARE RBC (CROSSMATCH)

## 2021-04-25 LAB — ABO/RH: ABO/RH(D): O POS

## 2021-04-25 LAB — PLATELET COUNT: Platelets: 361 10*3/uL (ref 150–400)

## 2021-04-25 SURGERY — LEFT HEART CATH AND CORONARY ANGIOGRAPHY
Anesthesia: LOCAL

## 2021-04-25 SURGERY — CORONARY ARTERY BYPASS GRAFTING (CABG)
Anesthesia: General | Site: Chest | Laterality: Right

## 2021-04-25 MED ORDER — FOLIC ACID 1 MG PO TABS
500.0000 ug | ORAL_TABLET | Freq: Every day | ORAL | Status: DC
  Administered 2021-04-26 – 2021-04-29 (×4): 0.5 mg via ORAL
  Filled 2021-04-25 (×5): qty 1

## 2021-04-25 MED ORDER — CHLORHEXIDINE GLUCONATE 0.12% ORAL RINSE (MEDLINE KIT): 15.0000 mL | Freq: Two times a day (BID) | OROMUCOSAL | Status: DC

## 2021-04-25 MED ORDER — LACTATED RINGERS IV SOLN
500.0000 mL | Freq: Once | INTRAVENOUS | Status: AC | PRN
  Administered 2021-04-27: 500 mL via INTRAVENOUS

## 2021-04-25 MED ORDER — PLASMA-LYTE A IV SOLN: INTRAVENOUS | Status: DC

## 2021-04-25 MED ORDER — PLASMA-LYTE 148 IV SOLN
INTRAVENOUS | Status: DC
  Filled 2021-04-25: qty 2.5

## 2021-04-25 MED ORDER — HEPARIN SODIUM (PORCINE) 1000 UNIT/ML IJ SOLN
INTRAMUSCULAR | Status: AC
  Filled 2021-04-25: qty 1

## 2021-04-25 MED ORDER — FENTANYL CITRATE (PF) 100 MCG/2ML IJ SOLN
INTRAMUSCULAR | Status: DC | PRN
  Administered 2021-04-25 (×3): 100 ug via INTRAVENOUS
  Administered 2021-04-25 (×2): 150 ug via INTRAVENOUS
  Administered 2021-04-25: 300 ug via INTRAVENOUS
  Administered 2021-04-25: 200 ug via INTRAVENOUS
  Administered 2021-04-25: 150 ug via INTRAVENOUS
  Administered 2021-04-25: 100 ug via INTRAVENOUS
  Administered 2021-04-25: 150 ug via INTRAVENOUS

## 2021-04-25 MED ORDER — ACETAMINOPHEN 160 MG/5ML PO SOLN
1000.0000 mg | Freq: Four times a day (QID) | ORAL | Status: DC
  Filled 2021-04-25: qty 40.6

## 2021-04-25 MED ORDER — ASPIRIN 81 MG PO CHEW: 324.0000 mg | CHEWABLE_TABLET | Freq: Every day | ORAL | Status: DC

## 2021-04-25 MED ORDER — NITROGLYCERIN IN D5W 200-5 MCG/ML-% IV SOLN
2.0000 ug/min | INTRAVENOUS | Status: AC
  Administered 2021-04-25: 16.6 ug/min via INTRAVENOUS
  Filled 2021-04-25: qty 250

## 2021-04-25 MED ORDER — TRAMADOL HCL 50 MG PO TABS
50.0000 mg | ORAL_TABLET | ORAL | Status: DC | PRN
  Administered 2021-04-26: 50 mg via ORAL
  Filled 2021-04-25: qty 1

## 2021-04-25 MED ORDER — TRANEXAMIC ACID 1000 MG/10ML IV SOLN
1.5000 mg/kg/h | INTRAVENOUS | Status: AC
  Administered 2021-04-25: 1.5 mg/kg/h via INTRAVENOUS
  Filled 2021-04-25: qty 20

## 2021-04-25 MED ORDER — VANCOMYCIN HCL 1000 MG IV SOLR
INTRAVENOUS | Status: DC | PRN
  Administered 2021-04-25: 3 g

## 2021-04-25 MED ORDER — ALBUMIN HUMAN 5 % IV SOLN
250.0000 mL | INTRAVENOUS | Status: AC | PRN
Start: 2021-04-25 — End: 2021-04-26
  Administered 2021-04-25 – 2021-04-26 (×3): 12.5 g via INTRAVENOUS
  Filled 2021-04-25: qty 250

## 2021-04-25 MED ORDER — MILRINONE LACTATE IN DEXTROSE 20-5 MG/100ML-% IV SOLN: 0.3000 ug/kg/min | INTRAVENOUS | Status: DC

## 2021-04-25 MED ORDER — MANNITOL 20 % IV SOLN
Freq: Once | INTRAVENOUS | Status: DC
  Filled 2021-04-25: qty 13

## 2021-04-25 MED ORDER — POTASSIUM CHLORIDE 2 MEQ/ML IV SOLN
80.0000 meq | INTRAVENOUS | Status: DC
  Filled 2021-04-25: qty 40

## 2021-04-25 MED ORDER — SODIUM CHLORIDE 0.9 % IV SOLN: 250.0000 mL | INTRAVENOUS | Status: DC

## 2021-04-25 MED ORDER — LACTATED RINGERS IV SOLN: INTRAVENOUS | Status: DC

## 2021-04-25 MED ORDER — DEXMEDETOMIDINE HCL IN NACL 400 MCG/100ML IV SOLN
0.1000 ug/kg/h | INTRAVENOUS | Status: DC
  Filled 2021-04-25 (×2): qty 100

## 2021-04-25 MED ORDER — PHENYLEPHRINE HCL-NACL 20-0.9 MG/250ML-% IV SOLN
30.0000 ug/min | INTRAVENOUS | Status: AC
  Administered 2021-04-25: 20 ug/min via INTRAVENOUS
  Filled 2021-04-25: qty 250

## 2021-04-25 MED ORDER — LACTATED RINGERS IV SOLN: INTRAVENOUS | Status: DC | PRN

## 2021-04-25 MED ORDER — MIDAZOLAM HCL 2 MG/2ML IJ SOLN
INTRAMUSCULAR | Status: DC | PRN
  Administered 2021-04-25: 2 mg via INTRAVENOUS
  Administered 2021-04-25: 1 mg via INTRAVENOUS

## 2021-04-25 MED ORDER — TRANEXAMIC ACID 1000 MG/10ML IV SOLN: 1.5000 mg/kg/h | INTRAVENOUS | Status: DC

## 2021-04-25 MED ORDER — ARTIFICIAL TEARS OPHTHALMIC OINT
TOPICAL_OINTMENT | OPHTHALMIC | Status: AC
  Filled 2021-04-25: qty 3.5

## 2021-04-25 MED ORDER — VANCOMYCIN HCL 10 G IV SOLR
1250.0000 mg | INTRAVENOUS | Status: AC
  Administered 2021-04-25: 1250 mg via INTRAVENOUS
  Filled 2021-04-25: qty 1250

## 2021-04-25 MED ORDER — PROPOFOL 10 MG/ML IV BOLUS
INTRAVENOUS | Status: DC | PRN
  Administered 2021-04-25: 30 mg via INTRAVENOUS
  Administered 2021-04-25: 40 mg via INTRAVENOUS

## 2021-04-25 MED ORDER — POTASSIUM CHLORIDE 10 MEQ/100ML IV SOLN
INTRAVENOUS | Status: AC
  Filled 2021-04-25: qty 100

## 2021-04-25 MED ORDER — MORPHINE SULFATE (PF) 2 MG/ML IV SOLN
1.0000 mg | INTRAVENOUS | Status: DC | PRN
  Filled 2021-04-25: qty 1

## 2021-04-25 MED ORDER — FENTANYL CITRATE (PF) 250 MCG/5ML IJ SOLN
INTRAMUSCULAR | Status: AC
  Filled 2021-04-25: qty 20

## 2021-04-25 MED ORDER — FENTANYL CITRATE (PF) 250 MCG/5ML IJ SOLN
INTRAMUSCULAR | Status: AC
  Filled 2021-04-25: qty 5

## 2021-04-25 MED ORDER — EPINEPHRINE HCL 5 MG/250ML IV SOLN IN NS
0.0000 ug/min | INTRAVENOUS | Status: DC
  Filled 2021-04-25: qty 250

## 2021-04-25 MED ORDER — POTASSIUM CHLORIDE 10 MEQ/100ML IV SOLN
INTRAVENOUS | Status: AC | PRN
  Administered 2021-04-25: 10 meq via INTRAVENOUS

## 2021-04-25 MED ORDER — MAGNESIUM SULFATE 50 % IJ SOLN
40.0000 meq | INTRAMUSCULAR | Status: DC
  Filled 2021-04-25: qty 9.85

## 2021-04-25 MED ORDER — SODIUM CHLORIDE 0.9% FLUSH
3.0000 mL | Freq: Two times a day (BID) | INTRAVENOUS | Status: DC
  Administered 2021-04-26 – 2021-04-27 (×2): 3 mL via INTRAVENOUS

## 2021-04-25 MED ORDER — HEPARIN (PORCINE) IN NACL 1000-0.9 UT/500ML-% IV SOLN
INTRAVENOUS | Status: DC | PRN
  Administered 2021-04-25 (×2): 500 mL

## 2021-04-25 MED ORDER — POTASSIUM CHLORIDE 10 MEQ/50ML IV SOLN: 10.0000 meq | INTRAVENOUS | Status: AC

## 2021-04-25 MED ORDER — HEPARIN SODIUM (PORCINE) 1000 UNIT/ML IJ SOLN
INTRAMUSCULAR | Status: DC | PRN
  Administered 2021-04-25: 25000 [IU] via INTRAVENOUS

## 2021-04-25 MED ORDER — DOCUSATE SODIUM 100 MG PO CAPS
200.0000 mg | ORAL_CAPSULE | Freq: Every day | ORAL | Status: DC
  Administered 2021-04-26 – 2021-04-27 (×2): 200 mg via ORAL
  Filled 2021-04-25 (×2): qty 2

## 2021-04-25 MED ORDER — CHLORHEXIDINE GLUCONATE CLOTH 2 % EX PADS
6.0000 | MEDICATED_PAD | Freq: Every day | CUTANEOUS | Status: DC
  Administered 2021-04-26 – 2021-04-27 (×2): 6 via TOPICAL

## 2021-04-25 MED ORDER — PHENYLEPHRINE HCL-NACL 20-0.9 MG/250ML-% IV SOLN: 0.0000 ug/min | INTRAVENOUS | Status: DC

## 2021-04-25 MED ORDER — ROCURONIUM BROMIDE 10 MG/ML (PF) SYRINGE
PREFILLED_SYRINGE | INTRAVENOUS | Status: AC
  Filled 2021-04-25: qty 10

## 2021-04-25 MED ORDER — HEPARIN SODIUM (PORCINE) 1000 UNIT/ML IJ SOLN
INTRAMUSCULAR | Status: DC | PRN
  Administered 2021-04-25: 3000 [IU] via INTRAVENOUS
  Administered 2021-04-25: 5000 [IU] via INTRAVENOUS

## 2021-04-25 MED ORDER — PROPOFOL 10 MG/ML IV BOLUS
INTRAVENOUS | Status: AC
  Filled 2021-04-25: qty 20

## 2021-04-25 MED ORDER — MAGNESIUM SULFATE 4 GM/100ML IV SOLN
4.0000 g | Freq: Once | INTRAVENOUS | Status: AC
  Administered 2021-04-25: 4 g via INTRAVENOUS
  Filled 2021-04-25: qty 100

## 2021-04-25 MED ORDER — FENTANYL CITRATE (PF) 100 MCG/2ML IJ SOLN
INTRAMUSCULAR | Status: DC | PRN
  Administered 2021-04-25 (×2): 25 ug via INTRAVENOUS

## 2021-04-25 MED ORDER — CALCIUM CHLORIDE 10 % IV SOLN
1.0000 g | Freq: Once | INTRAVENOUS | Status: AC
  Administered 2021-04-25: 1 g via INTRAVENOUS

## 2021-04-25 MED ORDER — SODIUM BICARBONATE 8.4 % IV SOLN
100.0000 meq | Freq: Once | INTRAVENOUS | Status: AC
  Administered 2021-04-25: 100 meq via INTRAVENOUS

## 2021-04-25 MED ORDER — PROTAMINE SULFATE 10 MG/ML IV SOLN
INTRAVENOUS | Status: DC | PRN
  Administered 2021-04-25: 225 mg via INTRAVENOUS
  Administered 2021-04-25: 25 mg via INTRAVENOUS

## 2021-04-25 MED ORDER — MIDAZOLAM HCL (PF) 10 MG/2ML IJ SOLN
INTRAMUSCULAR | Status: AC
  Filled 2021-04-25: qty 2

## 2021-04-25 MED ORDER — MANNITOL 20 % IV SOLN
INTRAVENOUS | Status: DC
  Filled 2021-04-25: qty 13

## 2021-04-25 MED ORDER — CEFAZOLIN SODIUM-DEXTROSE 2-4 GM/100ML-% IV SOLN
2.0000 g | Freq: Three times a day (TID) | INTRAVENOUS | Status: AC
Start: 2021-04-26 — End: 2021-04-27
  Administered 2021-04-26 – 2021-04-27 (×6): 2 g via INTRAVENOUS
  Filled 2021-04-25 (×7): qty 100

## 2021-04-25 MED ORDER — SUCCINYLCHOLINE CHLORIDE 200 MG/10ML IV SOSY
PREFILLED_SYRINGE | INTRAVENOUS | Status: AC
  Filled 2021-04-25: qty 10

## 2021-04-25 MED ORDER — TRANEXAMIC ACID (OHS) BOLUS VIA INFUSION
15.0000 mg/kg | INTRAVENOUS | Status: DC
  Administered 2021-04-25: 1033.5 mg via INTRAVENOUS

## 2021-04-25 MED ORDER — FAMOTIDINE IN NACL 20-0.9 MG/50ML-% IV SOLN
20.0000 mg | Freq: Two times a day (BID) | INTRAVENOUS | Status: DC
  Administered 2021-04-25: 20 mg via INTRAVENOUS
  Filled 2021-04-25: qty 50

## 2021-04-25 MED ORDER — SODIUM CHLORIDE 0.9 % IV SOLN: INTRAVENOUS | Status: DC

## 2021-04-25 MED ORDER — CEFAZOLIN SODIUM-DEXTROSE 2-4 GM/100ML-% IV SOLN
2.0000 g | INTRAVENOUS | Status: AC
  Administered 2021-04-25 (×2): 2 g via INTRAVENOUS
  Filled 2021-04-25: qty 100

## 2021-04-25 MED ORDER — SODIUM CHLORIDE 0.9 % IV SOLN: INTRAVENOUS | Status: DC | PRN

## 2021-04-25 MED ORDER — PROTAMINE SULFATE 10 MG/ML IV SOLN
INTRAVENOUS | Status: AC
  Filled 2021-04-25: qty 25

## 2021-04-25 MED ORDER — DEXMEDETOMIDINE HCL IN NACL 400 MCG/100ML IV SOLN: 0.0000 ug/kg/h | INTRAVENOUS | Status: DC

## 2021-04-25 MED ORDER — ROCURONIUM BROMIDE 10 MG/ML (PF) SYRINGE
PREFILLED_SYRINGE | INTRAVENOUS | Status: DC | PRN
  Administered 2021-04-25: 30 mg via INTRAVENOUS
  Administered 2021-04-25: 50 mg via INTRAVENOUS
  Administered 2021-04-25: 100 mg via INTRAVENOUS
  Administered 2021-04-25: 20 mg via INTRAVENOUS

## 2021-04-25 MED ORDER — MILRINONE LACTATE IN DEXTROSE 20-5 MG/100ML-% IV SOLN
0.3000 ug/kg/min | INTRAVENOUS | Status: AC
  Filled 2021-04-25 (×2): qty 100

## 2021-04-25 MED ORDER — VERAPAMIL HCL 2.5 MG/ML IV SOLN
INTRAVENOUS | Status: DC | PRN
  Administered 2021-04-25: 10 mL via INTRA_ARTERIAL

## 2021-04-25 MED ORDER — DEXMEDETOMIDINE HCL IN NACL 400 MCG/100ML IV SOLN
0.1000 ug/kg/h | INTRAVENOUS | Status: DC
  Administered 2021-04-25: .3 ug/kg/h via INTRAVENOUS

## 2021-04-25 MED ORDER — ADULT MULTIVITAMIN W/MINERALS CH
1.0000 | ORAL_TABLET | Freq: Every day | ORAL | Status: DC
  Administered 2021-04-26 – 2021-04-27 (×2): 1 via ORAL
  Filled 2021-04-25 (×2): qty 1

## 2021-04-25 MED ORDER — PHENYLEPHRINE 40 MCG/ML (10ML) SYRINGE FOR IV PUSH (FOR BLOOD PRESSURE SUPPORT)
PREFILLED_SYRINGE | INTRAVENOUS | Status: AC
  Filled 2021-04-25: qty 10

## 2021-04-25 MED ORDER — SODIUM CHLORIDE 0.9% FLUSH: 3.0000 mL | INTRAVENOUS | Status: DC | PRN

## 2021-04-25 MED ORDER — INSULIN REGULAR(HUMAN) IN NACL 100-0.9 UT/100ML-% IV SOLN: INTRAVENOUS | Status: DC

## 2021-04-25 MED ORDER — PHENYLEPHRINE 40 MCG/ML (10ML) SYRINGE FOR IV PUSH (FOR BLOOD PRESSURE SUPPORT)
PREFILLED_SYRINGE | INTRAVENOUS | Status: DC | PRN
  Administered 2021-04-25: 80 ug via INTRAVENOUS
  Administered 2021-04-25: 280 ug via INTRAVENOUS
  Administered 2021-04-25: 40 ug via INTRAVENOUS

## 2021-04-25 MED ORDER — SUCCINYLCHOLINE CHLORIDE 200 MG/10ML IV SOSY
PREFILLED_SYRINGE | INTRAVENOUS | Status: DC | PRN
  Administered 2021-04-25: 140 mg via INTRAVENOUS

## 2021-04-25 MED ORDER — ASPIRIN EC 325 MG PO TBEC: 325.0000 mg | DELAYED_RELEASE_TABLET | Freq: Every day | ORAL | Status: DC

## 2021-04-25 MED ORDER — SODIUM CHLORIDE 0.9% FLUSH
10.0000 mL | Freq: Two times a day (BID) | INTRAVENOUS | Status: DC
  Administered 2021-04-26 – 2021-04-27 (×2): 10 mL

## 2021-04-25 MED ORDER — METOCLOPRAMIDE HCL 5 MG/ML IJ SOLN
10.0000 mg | Freq: Four times a day (QID) | INTRAMUSCULAR | Status: AC
  Administered 2021-04-26 (×2): 10 mg via INTRAVENOUS
  Filled 2021-04-25 (×2): qty 2

## 2021-04-25 MED ORDER — ACETAMINOPHEN 500 MG PO TABS
1000.0000 mg | ORAL_TABLET | Freq: Four times a day (QID) | ORAL | Status: DC
  Administered 2021-04-26 – 2021-04-29 (×13): 1000 mg via ORAL
  Filled 2021-04-25 (×15): qty 2

## 2021-04-25 MED ORDER — 0.9 % SODIUM CHLORIDE (POUR BTL) OPTIME
TOPICAL | Status: DC | PRN
  Administered 2021-04-25: 4000 mL

## 2021-04-25 MED ORDER — METOPROLOL TARTRATE 5 MG/5ML IV SOLN: 2.5000 mg | INTRAVENOUS | Status: DC | PRN

## 2021-04-25 MED ORDER — METOPROLOL TARTRATE 25 MG/10 ML ORAL SUSPENSION
12.5000 mg | Freq: Two times a day (BID) | ORAL | Status: DC
  Filled 2021-04-25 (×5): qty 5

## 2021-04-25 MED ORDER — IOHEXOL 350 MG/ML SOLN
INTRAVENOUS | Status: DC | PRN
  Administered 2021-04-25: 60 mL via INTRA_ARTERIAL

## 2021-04-25 MED ORDER — VANCOMYCIN HCL IN DEXTROSE 1-5 GM/200ML-% IV SOLN
1000.0000 mg | Freq: Once | INTRAVENOUS | Status: AC
  Administered 2021-04-26: 1000 mg via INTRAVENOUS
  Filled 2021-04-25: qty 200

## 2021-04-25 MED ORDER — LIDOCAINE HCL (PF) 1 % IJ SOLN
INTRAMUSCULAR | Status: AC
  Filled 2021-04-25: qty 30

## 2021-04-25 MED ORDER — HEPARIN (PORCINE) IN NACL 1000-0.9 UT/500ML-% IV SOLN
INTRAVENOUS | Status: AC
  Filled 2021-04-25: qty 1000

## 2021-04-25 MED ORDER — ACETAMINOPHEN 650 MG RE SUPP
650.0000 mg | Freq: Once | RECTAL | Status: AC
  Administered 2021-04-25: 650 mg via RECTAL

## 2021-04-25 MED ORDER — SODIUM CHLORIDE 0.9% FLUSH: 10.0000 mL | INTRAVENOUS | Status: DC | PRN

## 2021-04-25 MED ORDER — LIDOCAINE HCL (PF) 1 % IJ SOLN
INTRAMUSCULAR | Status: DC | PRN
  Administered 2021-04-25: 2 mL via INTRADERMAL

## 2021-04-25 MED ORDER — NITROGLYCERIN 1 MG/10 ML FOR IR/CATH LAB
INTRA_ARTERIAL | Status: DC | PRN
  Administered 2021-04-25: 200 ug via INTRA_ARTERIAL

## 2021-04-25 MED ORDER — DEXTROSE 50 % IV SOLN: 0.0000 mL | INTRAVENOUS | Status: DC | PRN

## 2021-04-25 MED ORDER — INSULIN REGULAR(HUMAN) IN NACL 100-0.9 UT/100ML-% IV SOLN
INTRAVENOUS | Status: AC
  Administered 2021-04-25: 1.1 [IU]/h via INTRAVENOUS
  Filled 2021-04-25: qty 100

## 2021-04-25 MED ORDER — LIDOCAINE 2% (20 MG/ML) 5 ML SYRINGE
INTRAMUSCULAR | Status: AC
  Filled 2021-04-25: qty 5

## 2021-04-25 MED ORDER — ACETAMINOPHEN 160 MG/5ML PO SOLN: 650.0000 mg | Freq: Once | ORAL | Status: AC

## 2021-04-25 MED ORDER — OXYCODONE HCL 5 MG PO TABS
5.0000 mg | ORAL_TABLET | ORAL | Status: DC | PRN
  Administered 2021-04-27 – 2021-04-28 (×6): 5 mg via ORAL
  Filled 2021-04-25 (×5): qty 1
  Filled 2021-04-25: qty 2

## 2021-04-25 MED ORDER — FENTANYL CITRATE (PF) 100 MCG/2ML IJ SOLN
INTRAMUSCULAR | Status: AC
  Filled 2021-04-25: qty 2

## 2021-04-25 MED ORDER — VERAPAMIL HCL 2.5 MG/ML IV SOLN
INTRAVENOUS | Status: AC
  Filled 2021-04-25: qty 2

## 2021-04-25 MED ORDER — NOREPINEPHRINE 4 MG/250ML-% IV SOLN
0.0000 ug/min | INTRAVENOUS | Status: DC
  Filled 2021-04-25: qty 250

## 2021-04-25 MED ORDER — METOPROLOL TARTRATE 12.5 MG HALF TABLET
12.5000 mg | ORAL_TABLET | Freq: Two times a day (BID) | ORAL | Status: DC
  Administered 2021-04-26 – 2021-04-29 (×7): 12.5 mg via ORAL
  Filled 2021-04-25 (×7): qty 1

## 2021-04-25 MED ORDER — MIDAZOLAM HCL 2 MG/2ML IJ SOLN
INTRAMUSCULAR | Status: AC
  Filled 2021-04-25: qty 2

## 2021-04-25 MED ORDER — MIDAZOLAM HCL 2 MG/2ML IJ SOLN: 2.0000 mg | INTRAMUSCULAR | Status: DC | PRN

## 2021-04-25 MED ORDER — MILRINONE LACTATE IN DEXTROSE 20-5 MG/100ML-% IV SOLN: 0.1250 ug/kg/min | INTRAVENOUS | Status: DC

## 2021-04-25 MED ORDER — BISACODYL 10 MG RE SUPP: 10.0000 mg | Freq: Every day | RECTAL | Status: DC

## 2021-04-25 MED ORDER — MIDAZOLAM HCL (PF) 5 MG/ML IJ SOLN
INTRAMUSCULAR | Status: DC | PRN
  Administered 2021-04-25: 4 mg via INTRAVENOUS
  Administered 2021-04-25: 3 mg via INTRAVENOUS
  Administered 2021-04-25: 2 mg via INTRAVENOUS
  Administered 2021-04-25: 1 mg via INTRAVENOUS

## 2021-04-25 MED ORDER — SODIUM CHLORIDE 0.45 % IV SOLN: INTRAVENOUS | Status: DC | PRN

## 2021-04-25 MED ORDER — PANTOPRAZOLE SODIUM 40 MG PO TBEC
40.0000 mg | DELAYED_RELEASE_TABLET | Freq: Every day | ORAL | Status: DC
  Administered 2021-04-27 – 2021-04-29 (×3): 40 mg via ORAL
  Filled 2021-04-25 (×3): qty 1

## 2021-04-25 MED ORDER — LIDOCAINE 2% (20 MG/ML) 5 ML SYRINGE
INTRAMUSCULAR | Status: DC | PRN
  Administered 2021-04-25: 60 mg via INTRAVENOUS

## 2021-04-25 MED ORDER — CHLORHEXIDINE GLUCONATE 0.12 % MT SOLN
15.0000 mL | OROMUCOSAL | Status: AC
  Administered 2021-04-25: 15 mL via OROMUCOSAL

## 2021-04-25 MED ORDER — POTASSIUM CHLORIDE 10 MEQ/100ML IV SOLN
10.0000 meq | INTRAVENOUS | Status: DC
  Filled 2021-04-25: qty 100

## 2021-04-25 MED ORDER — NITROGLYCERIN IN D5W 200-5 MCG/ML-% IV SOLN: 0.0000 ug/min | INTRAVENOUS | Status: DC

## 2021-04-25 MED ORDER — EPINEPHRINE 1 MG/10ML IJ SOSY
PREFILLED_SYRINGE | INTRAMUSCULAR | Status: AC
  Filled 2021-04-25: qty 10

## 2021-04-25 MED ORDER — SODIUM CHLORIDE 0.9 % IV SOLN
INTRAVENOUS | Status: DC
  Filled 2021-04-25: qty 30

## 2021-04-25 MED ORDER — PLASMA-LYTE 148 IV SOLN
INTRAVENOUS | Status: DC | PRN
  Administered 2021-04-25: 500 mL via INTRAVASCULAR

## 2021-04-25 MED ORDER — TRANEXAMIC ACID (OHS) PUMP PRIME SOLUTION
2.0000 mg/kg | INTRAVENOUS | Status: AC
  Filled 2021-04-25: qty 1.38

## 2021-04-25 MED ORDER — ONDANSETRON HCL 4 MG/2ML IJ SOLN: 4.0000 mg | Freq: Four times a day (QID) | INTRAMUSCULAR | Status: DC | PRN

## 2021-04-25 MED ORDER — TRANEXAMIC ACID (OHS) PUMP PRIME SOLUTION: 2.0000 mg/kg | INTRAVENOUS | Status: DC

## 2021-04-25 MED ORDER — ORAL CARE MOUTH RINSE
15.0000 mL | OROMUCOSAL | Status: DC
  Administered 2021-04-26: 15 mL via OROMUCOSAL

## 2021-04-25 MED ORDER — BUPIVACAINE LIPOSOME 1.3 % IJ SUSP
INTRAMUSCULAR | Status: AC
  Filled 2021-04-25: qty 20

## 2021-04-25 MED ORDER — EPINEPHRINE 1 MG/10ML IJ SOSY
PREFILLED_SYRINGE | INTRAMUSCULAR | Status: DC | PRN
  Administered 2021-04-25: .05 mg via INTRAVENOUS

## 2021-04-25 MED ORDER — CEFAZOLIN SODIUM-DEXTROSE 2-4 GM/100ML-% IV SOLN
2.0000 g | INTRAVENOUS | Status: DC
  Filled 2021-04-25: qty 100

## 2021-04-25 MED ORDER — TRANEXAMIC ACID (OHS) BOLUS VIA INFUSION
15.0000 mg/kg | INTRAVENOUS | Status: AC
  Filled 2021-04-25: qty 1034

## 2021-04-25 MED ORDER — HEMOSTATIC AGENTS (NO CHARGE) OPTIME
TOPICAL | Status: DC | PRN
  Administered 2021-04-25 (×3): 1 via TOPICAL

## 2021-04-25 MED ORDER — VANCOMYCIN HCL 1000 MG IV SOLR
INTRAVENOUS | Status: AC
  Filled 2021-04-25: qty 3000

## 2021-04-25 MED ORDER — BISACODYL 5 MG PO TBEC
10.0000 mg | DELAYED_RELEASE_TABLET | Freq: Every day | ORAL | Status: DC
  Administered 2021-04-27: 10 mg via ORAL
  Filled 2021-04-25: qty 2

## 2021-04-25 MED ORDER — BUPIVACAINE HCL (PF) 0.5 % IJ SOLN
INTRAMUSCULAR | Status: AC
  Filled 2021-04-25: qty 30

## 2021-04-25 SURGICAL SUPPLY — 100 items
ADAPTER CARDIO PERF ANTE/RETRO (ADAPTER) ×4 IMPLANT
APPLICATOR TIP COSEAL (VASCULAR PRODUCTS) IMPLANT
BAG DECANTER FOR FLEXI CONT (MISCELLANEOUS) ×4 IMPLANT
BLADE CLIPPER SURG (BLADE) IMPLANT
BLADE STERNUM SYSTEM 6 (BLADE) ×4 IMPLANT
BLADE SURG 11 STRL SS (BLADE) ×4 IMPLANT
BNDG ELASTIC 4X5.8 VLCR STR LF (GAUZE/BANDAGES/DRESSINGS) ×4 IMPLANT
BNDG ELASTIC 6X5.8 VLCR STR LF (GAUZE/BANDAGES/DRESSINGS) ×4 IMPLANT
BNDG GAUZE ELAST 4 BULKY (GAUZE/BANDAGES/DRESSINGS) ×4 IMPLANT
CABLE PACING FASLOC BIEGE (MISCELLANEOUS) IMPLANT
CANISTER SUCT 3000ML PPV (MISCELLANEOUS) ×4 IMPLANT
CANNULA NON VENT 18FR 12 (CANNULA) ×4 IMPLANT
CATH CPB KIT HENDRICKSON (MISCELLANEOUS) ×4 IMPLANT
CATH ROBINSON RED A/P 18FR (CATHETERS) ×8 IMPLANT
CLIP RETRACTION 3.0MM CORONARY (MISCELLANEOUS) ×4 IMPLANT
CLIP VESOCCLUDE SM WIDE 24/CT (CLIP) ×12 IMPLANT
CONTAINER PROTECT SURGISLUSH (MISCELLANEOUS) ×4 IMPLANT
DEFOGGER ANTIFOG KIT (MISCELLANEOUS) ×4 IMPLANT
DERMABOND ADVANCED (GAUZE/BANDAGES/DRESSINGS) ×1
DERMABOND ADVANCED .7 DNX12 (GAUZE/BANDAGES/DRESSINGS) ×3 IMPLANT
DRAIN CHANNEL 28F RND 3/8 FF (WOUND CARE) ×8 IMPLANT
DRAPE CARDIOVASCULAR INCISE (DRAPES) ×1
DRAPE SRG 135X102X78XABS (DRAPES) ×3 IMPLANT
DRAPE WARM FLUID 44X44 (DRAPES) ×4 IMPLANT
DRSG AQUACEL AG ADV 3.5X10 (GAUZE/BANDAGES/DRESSINGS) ×4 IMPLANT
DRSG AQUACEL AG ADV 3.5X14 (GAUZE/BANDAGES/DRESSINGS) ×4 IMPLANT
ELECT CAUTERY BLADE 6.4 (BLADE) ×4 IMPLANT
ELECT REM PT RETURN 9FT ADLT (ELECTROSURGICAL) ×8
ELECTRODE REM PT RTRN 9FT ADLT (ELECTROSURGICAL) ×6 IMPLANT
FELT TEFLON 1X6 (MISCELLANEOUS) ×4 IMPLANT
GAUZE SPONGE 4X4 12PLY STRL (GAUZE/BANDAGES/DRESSINGS) ×8 IMPLANT
GAUZE SPONGE 4X4 12PLY STRL LF (GAUZE/BANDAGES/DRESSINGS) ×4 IMPLANT
GLOVE SURG MICRO LTX SZ6 (GLOVE) ×24 IMPLANT
GLOVE SURG MICRO LTX SZ6.5 (GLOVE) ×12 IMPLANT
GLOVE SURG UNDER POLY LF SZ6.5 (GLOVE) ×4 IMPLANT
GLOVE SURG UNDER POLY LF SZ7.5 (GLOVE) ×12 IMPLANT
GOWN STRL REUS W/ TWL LRG LVL3 (GOWN DISPOSABLE) ×21 IMPLANT
GOWN STRL REUS W/ TWL XL LVL3 (GOWN DISPOSABLE) ×3 IMPLANT
GOWN STRL REUS W/TWL LRG LVL3 (GOWN DISPOSABLE) ×7
GOWN STRL REUS W/TWL XL LVL3 (GOWN DISPOSABLE) ×1
HEMOSTAT POWDER SURGIFOAM 1G (HEMOSTASIS) IMPLANT
INSERT FOGARTY XLG (MISCELLANEOUS) ×4 IMPLANT
INSERT SUTURE HOLDER (MISCELLANEOUS) ×4 IMPLANT
IV ADAPTER SYR DOUBLE MALE LL (MISCELLANEOUS) ×4 IMPLANT
KIT BASIN OR (CUSTOM PROCEDURE TRAY) ×4 IMPLANT
KIT SUCTION CATH 14FR (SUCTIONS) ×4 IMPLANT
KIT TURNOVER KIT B (KITS) ×4 IMPLANT
KIT VASOVIEW HEMOPRO 2 VH 4000 (KITS) ×4 IMPLANT
MARKER GRAFT CORONARY BYPASS (MISCELLANEOUS) ×12 IMPLANT
NEEDLE 18GX1X1/2 (RX/OR ONLY) (NEEDLE) IMPLANT
NS IRRIG 1000ML POUR BTL (IV SOLUTION) ×16 IMPLANT
PACK E OPEN HEART (SUTURE) ×4 IMPLANT
PACK OPEN HEART (CUSTOM PROCEDURE TRAY) ×4 IMPLANT
PACK SPY-PHI (KITS) IMPLANT
PAD ARMBOARD 7.5X6 YLW CONV (MISCELLANEOUS) ×8 IMPLANT
PAD ELECT DEFIB RADIOL ZOLL (MISCELLANEOUS) ×4 IMPLANT
PENCIL BUTTON HOLSTER BLD 10FT (ELECTRODE) ×4 IMPLANT
POSITIONER HEAD DONUT 9IN (MISCELLANEOUS) ×4 IMPLANT
POWDER SURGICEL 3.0 GRAM (HEMOSTASIS) ×8 IMPLANT
PUNCH AORTIC ROTATE 4.5MM 8IN (MISCELLANEOUS) ×4 IMPLANT
SEALANT SURG COSEAL 4ML (VASCULAR PRODUCTS) IMPLANT
SEALANT SURG COSEAL 8ML (VASCULAR PRODUCTS) ×4 IMPLANT
SET MPS 3-ND DEL (MISCELLANEOUS) ×4 IMPLANT
SUPPORT HEART JANKE-BARRON (MISCELLANEOUS) ×4 IMPLANT
SUT BONE WAX W31G (SUTURE) ×4 IMPLANT
SUT MNCRL AB 3-0 PS2 18 (SUTURE) ×12 IMPLANT
SUT PDS AB 1 CTX 36 (SUTURE) ×8 IMPLANT
SUT PROLENE 3 0 SH DA (SUTURE) ×4 IMPLANT
SUT PROLENE 3 0 SH1 36 (SUTURE) ×4 IMPLANT
SUT PROLENE 5 0 C 1 36 (SUTURE) IMPLANT
SUT PROLENE 6 0 C 1 30 (SUTURE) ×12 IMPLANT
SUT PROLENE 7 0 BV 1 (SUTURE) ×8 IMPLANT
SUT PROLENE 7 0 BV1 MDA (SUTURE) ×4 IMPLANT
SUT PROLENE 8 0 BV175 6 (SUTURE) IMPLANT
SUT PROLENE BLUE 7 0 (SUTURE) ×4 IMPLANT
SUT SILK 2 0 SH CR/8 (SUTURE) IMPLANT
SUT SILK 3 0 SH CR/8 (SUTURE) IMPLANT
SUT STEEL 6MS V (SUTURE) ×4 IMPLANT
SUT STEEL SZ 6 DBL 3X14 BALL (SUTURE) ×4 IMPLANT
SUT VIC AB 1 CTX 36 (SUTURE) ×2
SUT VIC AB 1 CTX36XBRD ANBCTR (SUTURE) ×6 IMPLANT
SUT VIC AB 2-0 CT1 27 (SUTURE) ×1
SUT VIC AB 2-0 CT1 TAPERPNT 27 (SUTURE) ×3 IMPLANT
SUT VIC AB 2-0 CTX 27 (SUTURE) IMPLANT
SUT VIC AB 3-0 X1 27 (SUTURE) IMPLANT
SYR 10ML LL (SYRINGE) IMPLANT
SYR 30ML LL (SYRINGE) IMPLANT
SYR 3ML LL SCALE MARK (SYRINGE) IMPLANT
SYSTEM SAHARA CHEST DRAIN ATS (WOUND CARE) ×4 IMPLANT
TAPE CLOTH SURG 4X10 WHT LF (GAUZE/BANDAGES/DRESSINGS) ×4 IMPLANT
TAPE PAPER 2X10 WHT MICROPORE (GAUZE/BANDAGES/DRESSINGS) ×4 IMPLANT
TOWEL GREEN STERILE (TOWEL DISPOSABLE) ×4 IMPLANT
TOWEL GREEN STERILE FF (TOWEL DISPOSABLE) ×4 IMPLANT
TRAY FOLEY SLVR 16FR TEMP STAT (SET/KITS/TRAYS/PACK) IMPLANT
TUBING ART PRESS 72  MALE/FEM (TUBING) ×2
TUBING ART PRESS 72 MALE/FEM (TUBING) ×6 IMPLANT
TUBING LAP HI FLOW INSUFFLATIO (TUBING) ×4 IMPLANT
UNDERPAD 30X36 HEAVY ABSORB (UNDERPADS AND DIAPERS) ×4 IMPLANT
WATER STERILE IRR 1000ML POUR (IV SOLUTION) ×8 IMPLANT
WATER STERILE IRR 1000ML UROMA (IV SOLUTION) IMPLANT

## 2021-04-25 SURGICAL SUPPLY — 21 items
BALLN SAPPHIRE 2.5X12 (BALLOONS) ×2
BALLOON SAPPHIRE 2.5X12 (BALLOONS) ×1 IMPLANT
CATH 5FR JL3.5 JR4 ANG PIG MP (CATHETERS) ×2 IMPLANT
CATH LAUNCHER 5F JR4 (CATHETERS) ×2 IMPLANT
CATH TELEPORT (CATHETERS) ×2 IMPLANT
CATH VISTA GUIDE 6FR JR4 (CATHETERS) ×2 IMPLANT
DEVICE RAD COMP TR BAND LRG (VASCULAR PRODUCTS) ×2 IMPLANT
ELECT DEFIB PAD ADLT CADENCE (PAD) ×2 IMPLANT
GLIDESHEATH SLEND SS 6F .021 (SHEATH) ×2 IMPLANT
GUIDEWIRE INQWIRE 1.5J.035X260 (WIRE) ×1 IMPLANT
INQWIRE 1.5J .035X260CM (WIRE) ×2
KIT ENCORE 26 ADVANTAGE (KITS) ×4 IMPLANT
KIT HEART LEFT (KITS) ×2 IMPLANT
MAT PREVALON FULL STRYKER (MISCELLANEOUS) ×2 IMPLANT
PACK CARDIAC CATHETERIZATION (CUSTOM PROCEDURE TRAY) ×2 IMPLANT
TRANSDUCER W/STOPCOCK (MISCELLANEOUS) ×2 IMPLANT
TUBING CIL FLEX 10 FLL-RA (TUBING) ×2 IMPLANT
WIRE COUGAR XT STRL 190CM (WIRE) ×2 IMPLANT
WIRE DOC EXTENSION .014X145CM (WIRE) ×2 IMPLANT
WIRE HI TORQ VERSACORE-J 145CM (WIRE) ×2 IMPLANT
WIRE HI TORQ WHISPER MS 190CM (WIRE) ×2 IMPLANT

## 2021-04-25 NOTE — Anesthesia Procedure Notes (Signed)
Central Venous Catheter Insertion Performed by: Audry Pili, MD, anesthesiologist Start/End5/25/2022 4:14 PM, 04/25/2021 4:20 PM Patient location: OR. Preanesthetic checklist: patient identified, IV checked, risks and benefits discussed, surgical consent, monitors and equipment checked, pre-op evaluation, timeout performed and anesthesia consent Position: Trendelenburg Patient sedated Hand hygiene performed , maximum sterile barriers used  and Seldinger technique used Catheter size: 8.5 Fr Central line was placed.MAC introducer Procedure performed using ultrasound guided technique. Ultrasound Notes:anatomy identified, needle tip was noted to be adjacent to the nerve/plexus identified, no ultrasound evidence of intravascular and/or intraneural injection and image(s) printed for medical record Attempts: 1 Following insertion, line sutured, dressing applied and Biopatch. Post procedure assessment: blood return through all ports, no air and free fluid flow  Patient tolerated the procedure well with no immediate complications.

## 2021-04-25 NOTE — Progress Notes (Signed)
  Echocardiogram Echocardiogram Transesophageal has been performed.  Anna Vang 04/25/2021, 4:45 PM

## 2021-04-25 NOTE — Anesthesia Procedure Notes (Signed)
Procedure Name: Intubation Date/Time: 04/25/2021 4:09 PM Performed by: Barrington Ellison, CRNA Pre-anesthesia Checklist: Patient identified, Emergency Drugs available, Suction available and Patient being monitored Patient Re-evaluated:Patient Re-evaluated prior to induction Oxygen Delivery Method: Circle System Utilized Preoxygenation: Pre-oxygenation with 100% oxygen Induction Type: IV induction, Rapid sequence and Cricoid Pressure applied Laryngoscope Size: Mac and 3 Grade View: Grade I Tube type: Oral Tube size: 8.0 mm Number of attempts: 1 Airway Equipment and Method: Stylet and Oral airway Placement Confirmation: ETT inserted through vocal cords under direct vision,  positive ETCO2 and breath sounds checked- equal and bilateral Secured at: 21 cm Tube secured with: Tape Dental Injury: Teeth and Oropharynx as per pre-operative assessment

## 2021-04-25 NOTE — Consult Note (Signed)
[address]       [place] [address]             [place] Record #[id number] Date of Birth: 1976/02/23  Referring: No ref. provider found Primary Care: Elby Beck, FNP (Inactive) Primary Cardiologist:None  Chief Complaint:   No chief complaint on file. chest pain  History of Present Illness:     45 yo lady without signif PMHx developed SSCP this am which prompted 911 call. Upon arrival of EMS, STEMI dx'd. Taken to cath lab at [facility] where SCAD of RCA diagnosed. Unable to reestablish flow. Consult received for CABG. Still with ST elev but stable rhythm.    Current Activity/ Functional Status: Patient will be independent with mobility/ambulation, transfers, ADL's, IADL's.   Zubrod Score: At the time of surgery this patient's most appropriate activity status/level should be described as: []     0    Normal activity, no symptoms []     1    Restricted in physical strenuous activity but ambulatory, able to do out light work []     2    Ambulatory and capable of self care, unable to do work activities, up and about                 more than 50%  Of the time                            []     3    Only limited self care, in bed greater than 50% of waking hours []     4    Completely disabled, no self care, confined to bed or chair []     5    Moribund  Past Medical History:  Diagnosis Date  . Anxiety     Past Surgical History:  Procedure Laterality Date  . CESAREAN SECTION      Social History   Tobacco Use  Smoking Status Never Smoker  Smokeless Tobacco Never Used    Social History   Substance and Sexual Activity  Alcohol Use Yes  . Alcohol/week: 2.0 standard drinks  . Types: 2 Glasses of wine per week     No Known Allergies  Current Facility-Administered Medications  Medication Dose Route Frequency Provider Last Rate Last Admin  . ceFAZolin (ANCEF) IVPB 2g/100 mL premix  2 g Intravenous To OR  Lewin Pellow, Glenice Bow, MD      . ceFAZolin (ANCEF) IVPB 2g/100 mL premix  2 g Intravenous To OR Farrell Pantaleo Z, MD      . dexmedetomidine (PRECEDEX) 400 MCG/100ML (4 mcg/mL) infusion  0.1-0.7 mcg/kg/hr Intravenous To OR Goran Olden Z, MD      . EPINEPHrine (ADRENALIN) 4 mg in NS 250 mL (0.016 mg/mL) premix infusion  0-10 mcg/min Intravenous To OR Raynisha Avilla Z, MD      . heparin 30,000 units/NS 1000 mL solution for CELLSAVER   Other To OR Kruze Atchley, Glenice Bow, MD      . heparin sodium (porcine) 2,500 Units, papaverine 30 mg in electrolyte-148 (PLASMALYTE-148) 500 mL irrigation   Irrigation To OR Mackenzy Eisenberg Z, MD      . insulin regular, human (MYXREDLIN) 100 units/ 100 mL infusion   Intravenous To OR Hassie Mandt Z, MD      . magnesium sulfate (IV Push/IM) injection 40 mEq  40 mEq Other  To OR Kay Shippy, Glenice Bow, MD      . milrinone (PRIMACOR) 20 MG/100 ML (0.2 mg/mL) infusion  0.3 mcg/kg/min Intravenous To OR Laster Appling Z, MD      . nitroGLYCERIN 50 mg in dextrose 5 % 250 mL (0.2 mg/mL) infusion  2-200 mcg/min Intravenous To OR Adalaya Irion, Glenice Bow, MD      . norepinephrine (LEVOPHED) 4mg  in 247mL premix infusion  0-40 mcg/min Intravenous To OR Nichalas Coin Z, MD      . phenylephrine (NEOSYNEPHRINE) 20-0.9 MG/250ML-% infusion  30-200 mcg/min Intravenous To OR Daeja Helderman Z, MD      . potassium chloride 10 mEq in 100 mL IVPB  10 mEq Intravenous Q1 Hr x 4 Sherren Mocha, MD      . potassium chloride injection 80 mEq  80 mEq Other To OR Zariel Capano, Glenice Bow, MD      . tranexamic acid (CYKLOKAPRON) 2,500 mg in sodium chloride 0.9 % 250 mL (10 mg/mL) infusion  1.5 mg/kg/hr Intravenous To OR Kanon Colunga Z, MD      . tranexamic acid (CYKLOKAPRON) bolus via infusion - over 30 minutes 15 mg/kg  15 mg/kg Intravenous To OR Nyasia Baxley Z, MD      . tranexamic acid (CYKLOKAPRON) pump prime solution 2 mg/kg  2 mg/kg Intracatheter To OR Ilka Lovick, Glenice Bow, MD      . vancomycin (VANCOCIN)  1,250 mg in sodium chloride 0.9 % 250 mL IVPB  1,250 mg Intravenous To OR Naheem Mosco, Glenice Bow, MD       Facility-Administered Medications Ordered in Other Encounters  Medication Dose Route Frequency Provider Last Rate Last Admin  . fentaNYL (SUBLIMAZE) injection    PRN Sherren Mocha, MD   25 mcg at 04/25/21 1509  . Heparin (Porcine) in NaCl 1000-0.9 UT/500ML-% SOLN    PRN Sherren Mocha, MD   500 mL at 04/25/21 1432  . heparin sodium (porcine) injection    PRN Sherren Mocha, MD   3,000 Units at 04/25/21 1450  . lidocaine (PF) (XYLOCAINE) 1 % injection    PRN Sherren Mocha, MD   2 mL at 04/25/21 1434  . midazolam (VERSED) injection    PRN Sherren Mocha, MD   1 mg at 04/25/21 1509  . nitroGLYCERIN 1 mg/10 mL (100 mcg/mL) - IR/CATH LAB    PRN Sherren Mocha, MD   200 mcg at 04/25/21 1444  . Radial Cocktail/Verapamil only    PRN Sherren Mocha, MD   10 mL at 04/25/21 1436    Medications Prior to Admission  Medication Sig Dispense Refill Last Dose  . folic acid (FOLVITE) 161 MCG tablet Take 400 mcg by mouth daily.     . Multiple Vitamins-Minerals (MULTIVITAMIN WITH MINERALS) tablet Take 1 tablet by mouth daily.     . mupirocin ointment (BACTROBAN) 2 % Apply 1 application topically 2 (two) times daily. 22 g 0     Family History  Problem Relation Age of Onset  . Mental illness Mother   . Kidney disease Mother   . Hypertension Father   . Diabetes Father   . Hypertension Brother      Review of Systems:   ROS Review of systems not obtained due to patient factors.     Cardiac Review of Systems: Y or  [    ]= no  Chest Pain [    ]  Resting SOB [   ] Exertional SOB  [  ]  Orthopnea [  ]   Pedal Edema [   ]  Palpitations [  ] Syncope  [  ]   Presyncope [   ]  General Review of Systems: [Y] = yes [  ]=no Constitional: recent weight change [  ]; anorexia [  ]; fatigue [  ]; nausea [  ]; night sweats [  ]; fever [  ]; or chills [  ]                                                                Dental: Last Dentist visit:   Eye : blurred vision [  ]; diplopia [   ]; vision changes [  ];  Amaurosis fugax[  ]; Resp: cough [  ];  wheezing[  ];  hemoptysis[  ]; shortness of breath[  ]; paroxysmal nocturnal dyspnea[  ]; dyspnea on exertion[  ]; or orthopnea[  ];  GI:  gallstones[  ], vomiting[  ];  dysphagia[  ]; melena[  ];  hematochezia [  ]; heartburn[  ];   Hx of  Colonoscopy[  ]; GU: kidney stones [  ]; hematuria[  ];   dysuria [  ];  nocturia[  ];  history of     obstruction [  ]; urinary frequency [  ]             Skin: rash, swelling[  ];, hair loss[  ];  peripheral edema[  ];  or itching[  ]; Musculosketetal: myalgias[  ];  joint swelling[  ];  joint erythema[  ];  joint pain[  ];  back pain[  ];  Heme/Lymph: bruising[  ];  bleeding[  ];  anemia[  ];  Neuro: TIA[  ];  headaches[  ];  stroke[  ];  vertigo[  ];  seizures[  ];   paresthesias[  ];  difficulty walking[  ];  Psych:depression[  ]; anxiety[  ];  Endocrine: diabetes[  ];  thyroid dysfunction[  ];        Physical Exam: There were no vitals taken for this visit.   General appearance: mild distress Resp: clear to auscultation bilaterally Cardio: regular rate and rhythm, S1, S2 normal, no murmur, click, rub or gallop GI: soft, non-tender; bowel sounds normal; no masses,  no organomegaly Extremities: extremities normal, atraumatic, no cyanosis or edema Neurologic: Alert and oriented X 3, normal strength and tone. Normal symmetric reflexes. Normal coordination and gait  Diagnostic Studies & Laboratory data:     Recent Radiology Findings:   No results found.   I have independently reviewed the above radiologic studies and discussed with the patient   Recent Lab Findings: Lab Results  Component Value Date   WBC 17.3 (H) 04/25/2021   HGB 13.6 04/25/2021   HCT 40.0 04/25/2021   PLT 344 04/25/2021   GLUCOSE 137 (H) 04/25/2021   CHOL 225 (H) 01/13/2017   TRIG 147.0 01/13/2017   HDL 56.30 01/13/2017    LDLCALC 139 (H) 01/13/2017   ALT 13 01/13/2017   AST 16 01/13/2017   NA 139 04/25/2021   K 2.9 (L) 04/25/2021   CL 106 04/25/2021   CREATININE 0.50 04/25/2021   BUN 15 04/25/2021   CO2 24 01/13/2017   TSH 0.83 01/13/2017   INR 0.9 04/25/2021      Assessment / Plan:  To OR for emergent CABG. Plan SVG to RCA   I  spent 20 minutes counseling the patient face to face.  Kam Kushnir Z. Orvan Seen, Carlos 04/25/2021 3:29 PM

## 2021-04-25 NOTE — Procedures (Signed)
Extubation Procedure Note Pt extubated following Cardiac wean. Pt follows all commands. NIF -30, VC 1.4L, No stridor, Cuff leak +.   Patient Details:   Name: Anna Vang DOB: [birth year] MRN: [id number]   Airway Documentation:    Vent end date: 04/25/21 Vent end time: 2342   Evaluation  O2 sats: stable throughout Complications: No apparent complications Patient did tolerate procedure well. Bilateral Breath Sounds: Clear   Yes  Marissa Nestle 04/25/2021, 11:45 PM

## 2021-04-25 NOTE — Anesthesia Procedure Notes (Signed)
Central Venous Catheter Insertion Performed by: Audry Pili, MD, anesthesiologist Start/End5/25/2022 4:20 PM, 04/25/2021 4:21 PM Patient location: Pre-op. Preanesthetic checklist: patient identified, IV checked, risks and benefits discussed, surgical consent, monitors and equipment checked, pre-op evaluation, timeout performed and anesthesia consent Position: Trendelenburg Hand hygiene performed  and maximum sterile barriers used  Total catheter length 10. PA cath was placed.Swan type:thermodilution PA Cath depth:47 Procedure performed without using ultrasound guided technique. Attempts: 1 Patient tolerated the procedure well with no immediate complications.

## 2021-04-25 NOTE — Progress Notes (Signed)
RT NOTE:  Cardiac rapid wean initiated. Pt awake and following commands.

## 2021-04-25 NOTE — Hospital Course (Addendum)
History of Present Illness:  Patient is a 45 yo female with no significant PMH.  The patient was in her usual state of health today when she got out of the shower and started to dry her hair.  She developed sudden onset centralized chest pain, nausea, diaphoresis, and was grey in color.  She thought she might pass out.  Her husband called EMS.  EKG obtained by EMS showed ST elevation in the inferior leads.  They initiated code STEMI and she was treated with ASA and Morphine prior to arrival to the hospital.  Repeat EKG in ED showed improvement of ST elevation.  Her chest pain persisted but was rated as 4/10.  She was taken directly to the cath lab where she was noted to have spontaneous dissection of the RCA.  Balloon angioplasty with PCI was attempted but flow was unable to be restored.  Emergent cardiothoracic consultation was requested.  She was evaluated by Dr. Orvan Seen who was in agreement the patient would benefit from emergent revascularization.  The risks and benefits of the procedure were explained to the patient and she was agreeable to proceed.  Hospital Course:  Patient was taken directly to the operating room.  She underwent CABG x 1 utilizing SVG to RCA.  She underwent endoscopic harvest of greater saphenous vein from her right thigh.  She tolerated the procedure without difficulty and was taken to the SICU in stable condition.  She remained hemodynamically stable.  She was weaned from the ventilator and extubated without difficulty by 12 AM.  The monitoring lines were removed on the first postoperative day and she was mobilized with the cardiac rehab team.  She progressed well.  The oxygen was weaned off without difficulty.  By the second postoperative day, the chest tube drainage had tapered off so the chest tubes were removed along with the Foley catheter and the right IJ introducer.  She was transferred to 4 E.  Diuretics were not initiated at this point due to marginal blood pressure.

## 2021-04-25 NOTE — Anesthesia Preprocedure Evaluation (Addendum)
Anesthesia Evaluation  Patient identified by MRN, date of birth, ID band Patient awake    Reviewed: Allergy & Precautions, NPO status , Patient's Chart, lab work & pertinent test results, Unable to perform ROS - Chart review onlyPreop documentation limited or incomplete due to emergent nature of procedure.  Airway Mallampati: III  TM Distance: >3 FB Neck ROM: Full    Dental  (+) Dental Advisory Given, Teeth Intact   Pulmonary neg pulmonary ROS,    Pulmonary exam normal        Cardiovascular + Past MI  Normal cardiovascular exam   RCA dissection    Neuro/Psych PSYCHIATRIC DISORDERS Anxiety negative neurological ROS     GI/Hepatic negative GI ROS, Neg liver ROS,   Endo/Other   Hypokalemia - being treated   Renal/GU negative Renal ROS     Musculoskeletal negative musculoskeletal ROS (+)   Abdominal   Peds  Hematology negative hematology ROS (+)   Anesthesia Other Findings CAD  Reproductive/Obstetrics                            Anesthesia Physical Anesthesia Plan  ASA: IV and emergent  Anesthesia Plan: General   Post-op Pain Management:    Induction: Intravenous  PONV Risk Score and Plan: 3 and Treatment may vary due to age or medical condition  Airway Management Planned: Oral ETT  Additional Equipment: Arterial line, CVP, PA Cath, TEE and Ultrasound Guidance Line Placement  Intra-op Plan:   Post-operative Plan: Post-operative intubation/ventilation  Informed Consent: I have reviewed the patients History and Physical, chart, labs and discussed the procedure including the risks, benefits and alternatives for the proposed anesthesia with the patient or authorized representative who has indicated his/her understanding and acceptance.     Only emergency history available, History available from chart only and Dental advisory given  Plan Discussed with: Anesthesiologist, CRNA and  Surgeon  Anesthesia Plan Comments:        Anesthesia Quick Evaluation

## 2021-04-25 NOTE — Brief Op Note (Signed)
04/25/2021  6:06 PM  PATIENT:  Anna Vang  45 y.o. female  PRE-OPERATIVE DIAGNOSIS:  SCAD  POST-OPERATIVE DIAGNOSIS:  SCAD  PROCEDURE:  Procedure(s):  CORONARY ARTERY BYPASS GRAFTING x 1 SVG to PDA  TRANSESOPHAGEAL ECHOCARDIOGRAM (TEE) (N/A)  ENDOVEIN HARVEST OF GREATER SAPHENOUS VEIN  -Right Thigh (20/5)  SURGEON:  Surgeon(s) and Role:    * Wonda Olds, MD - Primary  PHYSICIAN ASSISTANT: Ellwood Handler PA-C  ASSISTANTS: Despina Arias RNFA   ANESTHESIA:   general   BLOOD ADMINISTERED: CELLSAVER  DRAINS: Mediastinal Chest Drains   LOCAL MEDICATIONS USED:  NONE  SPECIMEN:  No Specimen  DISPOSITION OF SPECIMEN:  N/A  COUNTS:  YES  TOURNIQUET:  * No tourniquets in log *  DICTATION: .Dragon Dictation  PLAN OF CARE: Admit to inpatient   PATIENT DISPOSITION:  ICU - intubated and hemodynamically stable.   Delay start of Pharmacological VTE agent (>24hrs) due to surgical blood loss or risk of bleeding: yes

## 2021-04-25 NOTE — H&P (Addendum)
Cardiology Admission History and Physical:   Patient ID: Anna Vang MRN: [id number]; DOB: August 29, 1976   Admission date: 04/25/2021  PCP:  Elby Beck, FNP (Inactive)   [place] Providers Cardiologist:  None    Chief Complaint:  Chest pain/STEMI  Patient Profile:   Anna Vang is a 45 y.o. female with no significant PMH who is being seen 04/25/2021 for the evaluation of chest pain/STEMI.  History of Present Illness:   Anna Vang is a 45 yo female with no significant PMH. She denies ever having been seen by cardiology in the past. No significant family hx. Reports having intermittent episode of GERD like sensation over the past couple of days. This morning was in her usual state of health. Was off work today and home with her husband. Got out of the shower and started to dry her hair. Developed centralized chest pain, nausea, diaphoresis, and became gray. Felt as though she might pass out. Husband called EMS given her symptoms. On EMS arrival EKG showed ST elevation in inferior leads along with reciprocal depression in lead I, aVL and V2 . CODE STEMI was called in the field. Was given 324mg  ASA and 4mg  morphine PTA. On arrival her EKG actually showed improving ST elevation in inferior lead, with continued reciprocal changes in lead I, aVL and v2. Reported pain was a 4/10 on arrival. She was brought directly to the cath lab for emergent cardiac cath with Dr. Burt Knack at the bedside on arrival to the cath lab.   Past Medical History:  Diagnosis Date  . Anxiety     Past Surgical History:  Procedure Laterality Date  . CESAREAN SECTION       Medications Prior to Admission: Prior to Admission medications   Medication Sig Start Date End Date Taking? Authorizing Provider  folic acid (FOLVITE) 742 MCG tablet Take 400 mcg by mouth daily.    [provider]  Multiple Vitamins-Minerals (MULTIVITAMIN WITH MINERALS) tablet Take 1 tablet by mouth daily.    [provider]  mupirocin ointment (BACTROBAN) 2 % Apply 1 application topically 2 (two) times daily. 03/06/17   Inda Coke, PA     Allergies:   No Known Allergies  Social History:   Social History   Socioeconomic History  . Marital status: Married    Spouse name: Not on file  . Number of children: Not on file  . Years of education: Not on file  . Highest education level: Not on file  Occupational History  . Not on file  Tobacco Use  . Smoking status: Never Smoker  . Smokeless tobacco: Never Used  Substance and Sexual Activity  . Alcohol use: Yes    Alcohol/week: 2.0 standard drinks    Types: 2 Glasses of wine per week  . Drug use: No  . Sexual activity: Yes    Birth control/protection: None  Other Topics Concern  . Not on file  Social History Narrative  . Not on file   Social Determinants of Health   Financial Resource Strain: Not on file  Food Insecurity: Not on file  Transportation Needs: Not on file  Physical Activity: Not on file  Stress: Not on file  Social Connections: Not on file  Intimate Partner Violence: Not on file    Family History:   The patient's family history includes Diabetes in her father; Hypertension in her brother and father; Kidney disease in her mother; Mental illness in her mother.    ROS:  Please  see the history of present illness.  All other ROS reviewed and negative.     Physical Exam/Data:  There were no vitals filed for this visit. No intake or output data in the 24 hours ending 04/25/21 1502 Last 3 Weights 03/06/2017 01/13/2017  Weight (lbs) 145 lb 4 oz 146 lb 3.2 oz  Weight (kg) 65.885 kg 66.316 kg     There is no height or weight on file to calculate BMI.  General:  Young female, no distress but slightly pale HEENT: normal Lymph: no adenopathy Neck: no JVD Endocrine:  No thryomegaly Vascular: No carotid bruits; FA pulses 2+ bilaterally without bruits  Cardiac:  normal S1, S2; RRR; no murmur  Lungs:  clear to  auscultation bilaterally, no wheezing, rhonchi or rales  Abd: soft, nontender, no hepatomegaly  Ext: no edema Musculoskeletal:  No deformities, BUE and BLE strength normal and equal Skin: warm and dry  Neuro:  CNs 2-12 intact, no focal abnormalities noted Psych:  Normal affect    EKG:  The ECG that was done 04/25/21 was personally reviewed and appears to be a junctional rhythm with ST elevation in inferior leads with reciprocal depression in lead I, aVL and v2  Relevant CV Studies:  N/a   Laboratory Data:  High Sensitivity Troponin:  No results for input(s): TROPONINIHS in the last 720 hours.    Chemistry Recent Labs  Lab 04/25/21 1438  NA 139  K 2.9*  CL 106  GLUCOSE 137*  BUN 15  CREATININE 0.50    No results for input(s): PROT, ALBUMIN, AST, ALT, ALKPHOS, BILITOT in the last 168 hours. Hematology Recent Labs  Lab 04/25/21 1438  HGB 13.6  HCT 40.0   BNPNo results for input(s): BNP, PROBNP in the last 168 hours.  DDimer No results for input(s): DDIMER in the last 168 hours.   Radiology/Studies:  No results found.   Assessment and Plan:   Anna Vang is a 45 y.o. female with no significant PMH who is being seen 04/25/2021 for the evaluation of chest pain/STEMI.  Acute inferior STEMI: developed symptoms around 1pm on admission. EKG with ST elevation in inferior leads. With no significant family hx or PMH of CAD/risk factors, concern for SCAD. Given 324mg  ASA and 4mg  morphine PTA. Brought directly to the cath lab for emergent cardiac cath.  -- further recommendations pending cath   Risk Assessment/Risk Scores:    TIMI Risk Score for ST  Elevation MI:   The patient's TIMI risk score is 0, which indicates a 0.8% risk of all cause mortality at 30 days.   Severity of Illness: The appropriate patient status for this patient is INPATIENT. Inpatient status is judged to be reasonable and necessary in order to provide the required intensity of service to ensure the  patient's safety. The patient's presenting symptoms, physical exam findings, and initial radiographic and laboratory data in the context of their chronic comorbidities is felt to place them at high risk for further clinical deterioration. Furthermore, it is not anticipated that the patient will be medically stable for discharge from the hospital within 2 midnights of admission. The following factors support the patient status of inpatient.   " The patient's presenting symptoms include chest pain, nausea, diaphoresis. " The worrisome physical exam findings include pale appearing young female. " The initial radiographic and laboratory data are worrisome because of labs pending on arrival to cath lab. " The chronic co-morbidities include n/a.   * I certify that at the point  of admission it is my clinical judgment that the patient will require inpatient hospital care spanning beyond 2 midnights from the point of admission due to high intensity of service, high risk for further deterioration and high frequency of surveillance required.*    For questions or updates, please contact [facility] Please consult [email or web address] for contact info under     Signed, Reino Bellis, NP  04/25/2021 3:02 PM   Patient seen, examined. Available data reviewed. Agree with findings, assessment, and plan as outlined by Reino Bellis, NP.  45 year old woman with acute inferior STEMI.  The patient developed abrupt chest pain, diaphoresis, nausea, and feeling of weakness within the past few hours.  EMS was called and she was diagnosed with an inferior STEMI in the field.  She is examined on arrival to the cardiac catheterization lab.  The patient has no past cardiac history.  She has no traditional risk factors for coronary artery disease and specifically denies family history of CAD, any personal history of tobacco use, hypertension, diabetes, or hyperlipidemia.  She continues to have chest discomfort at the time of my  evaluation and reports that it is located in the substernal area.  The patient is alert, oriented, in mild distress secondary to pain.  HEENT is normal, carotid upstrokes normal without bruits, JVP is normal, lung fields are clear bilaterally, heart is regular rate and rhythm with no murmur or gallop, abdomen is soft and nontender, extremities have no edema, skin is warm and dry with no rash.  EKG shows sinus rhythm with acute inferior STEMI pattern.  Plan: Emergency cardiac catheterization and possible PCI.  Emergency implied consent is obtained.  I explained the procedure to the patient as well as its risks, the indications to perform the procedure, and the potential benefit.  All of her questions are answered.  We will proceed with cardiac catheterization.  Further plans to follow.  Sherren Mocha, M.D. 04/25/2021 3:47 PM

## 2021-04-25 NOTE — Transfer of Care (Signed)
Immediate Anesthesia Transfer of Care Note  Patient: Anna Vang  Procedure(s) Performed: CORONARY ARTERY BYPASS GRAFTING (CABG) times one using right greater saphenous vein harvested endoscopically (N/A Chest) TRANSESOPHAGEAL ECHOCARDIOGRAM (TEE) (N/A ) ENDOVEIN HARVEST OF GREATER SAPHENOUS VEIN (Right )  Patient Location: SICU  Anesthesia Type:General  Level of Consciousness: Patient remains intubated per anesthesia plan  Airway & Oxygen Therapy: Patient remains intubated per anesthesia plan and Patient placed on Ventilator (see vital sign flow sheet for setting)  Post-op Assessment: Report given to RN and Post -op Vital signs reviewed and stable  Post vital signs: Reviewed and stable  Last Vitals:  Vitals Value Taken Time  BP    Temp    Pulse 80 04/25/21 1942  Resp 15 04/25/21 1942  SpO2 99 % 04/25/21 1942  Vitals shown include unvalidated device data.  Last Pain: There were no vitals filed for this visit.       Complications: No complications documented.

## 2021-04-25 NOTE — Anesthesia Postprocedure Evaluation (Signed)
Anesthesia Post Note  Patient: Anna Vang  Procedure(s) Performed: CORONARY ARTERY BYPASS GRAFTING (CABG) times one using right greater saphenous vein harvested endoscopically (N/A Chest) TRANSESOPHAGEAL ECHOCARDIOGRAM (TEE) (N/A ) ENDOVEIN HARVEST OF GREATER SAPHENOUS VEIN (Right )     Patient location during evaluation: SICU Anesthesia Type: General Level of consciousness: sedated and patient remains intubated per anesthesia plan Pain management: pain level controlled Vital Signs Assessment: post-procedure vital signs reviewed and stable Respiratory status: patient remains intubated per anesthesia plan and patient on ventilator - see flowsheet for VS Cardiovascular status: stable Anesthetic complications: no   No complications documented.  Last Vitals:  Vitals:   04/25/21 2100 04/25/21 2115  BP: 107/72   Pulse: 80 79  Resp: 14 13  Temp: (!) 34.9 C (!) 34.9 C  SpO2: 100% 100%    Last Pain: There were no vitals filed for this visit.               Emberlin Verner COKER

## 2021-04-25 NOTE — Progress Notes (Signed)
20 gauge arrow catheter placed in left radial artery x 2 attempts.  Good waveform post placement.  Dressed with standard dressing and biopatch.  After placement I placed a TR band on right wrist and pulled the 6 french slender sheath out of the right radial artery.  12 cc of air in the band with slightly dampened waveform from the pulse ox.  Site looks good with no hematoma.  RN will follow protocol for band removal.

## 2021-04-26 ENCOUNTER — Encounter (HOSPITAL_COMMUNITY): Payer: Self-pay | Admitting: Cardiovascular Disease

## 2021-04-26 ENCOUNTER — Inpatient Hospital Stay (HOSPITAL_COMMUNITY): Payer: 59

## 2021-04-26 ENCOUNTER — Other Ambulatory Visit: Payer: Self-pay

## 2021-04-26 LAB — BASIC METABOLIC PANEL
Anion gap: 6 (ref 5–15)
Anion gap: 6 (ref 5–15)
BUN: 9 mg/dL (ref 6–20)
BUN: 8 mg/dL (ref 6–20)
CO2: 23 mmol/L (ref 22–32)
CO2: 21 mmol/L — ABNORMAL LOW (ref 22–32)
Calcium: 7.2 mg/dL — ABNORMAL LOW (ref 8.9–10.3)
Calcium: 6.8 mg/dL — ABNORMAL LOW (ref 8.9–10.3)
Chloride: 106 mmol/L (ref 98–111)
Chloride: 98 mmol/L (ref 98–111)
Creatinine, Ser: 0.62 mg/dL (ref 0.44–1.00)
Creatinine, Ser: 0.61 mg/dL (ref 0.44–1.00)
GFR, Estimated: >60 mL/min (ref >60)
GFR, Estimated: >60 mL/min (ref >60)
Glucose, Bld: 124 mg/dL — ABNORMAL HIGH (ref 70–99)
Glucose, Bld: 123 mg/dL — ABNORMAL HIGH (ref 70–99)
Potassium: 4 mmol/L (ref 3.5–5.1)
Potassium: 3.2 mmol/L — ABNORMAL LOW (ref 3.5–5.1)
Sodium: 135 mmol/L (ref 135–145)
Sodium: 125 mmol/L — ABNORMAL LOW (ref 135–145)

## 2021-04-26 LAB — CBC
HCT: 30.2 % — ABNORMAL LOW (ref 36.0–46.0)
HCT: 25.1 % — ABNORMAL LOW (ref 36.0–46.0)
Hemoglobin: 10.2 g/dL — ABNORMAL LOW (ref 12.0–15.0)
Hemoglobin: 8.4 g/dL — ABNORMAL LOW (ref 12.0–15.0)
MCH: 31.9 pg (ref 26.0–34.0)
MCH: 31.7 pg (ref 26.0–34.0)
MCHC: 33.8 g/dL (ref 30.0–36.0)
MCHC: 33.5 g/dL (ref 30.0–36.0)
MCV: 94.4 fL (ref 80.0–100.0)
MCV: 94.7 fL (ref 80.0–100.0)
Platelets: 250 10*3/uL (ref 150–400)
Platelets: 188 10*3/uL (ref 150–400)
RBC: 3.2 MIL/uL — ABNORMAL LOW (ref 3.87–5.11)
RBC: 2.65 MIL/uL — ABNORMAL LOW (ref 3.87–5.11)
RDW: 12.4 % (ref 11.5–15.5)
RDW: 12.7 % (ref 11.5–15.5)
WBC: 19.6 10*3/uL — ABNORMAL HIGH (ref 4.0–10.5)
WBC: 13.9 10*3/uL — ABNORMAL HIGH (ref 4.0–10.5)
nRBC: 0 % (ref 0.0–0.2)
nRBC: 0 % (ref 0.0–0.2)

## 2021-04-26 LAB — POCT I-STAT 7, (LYTES, BLD GAS, ICA,H+H)
Acid-Base Excess: 0 mmol/L (ref 0.0–2.0)
Acid-Base Excess: 1 mmol/L (ref 0.0–2.0)
Acid-base deficit: 1 mmol/L (ref 0.0–2.0)
Bicarbonate: 23.6 mmol/L (ref 20.0–28.0)
Bicarbonate: 24.6 mmol/L (ref 20.0–28.0)
Bicarbonate: 25.2 mmol/L (ref 20.0–28.0)
Calcium, Ion: 1.08 mmol/L — ABNORMAL LOW (ref 1.15–1.40)
Calcium, Ion: 1.11 mmol/L — ABNORMAL LOW (ref 1.15–1.40)
Calcium, Ion: 1.19 mmol/L (ref 1.15–1.40)
HCT: 29 % — ABNORMAL LOW (ref 36.0–46.0)
HCT: 30 % — ABNORMAL LOW (ref 36.0–46.0)
HCT: 36 % (ref 36.0–46.0)
Hemoglobin: 10.2 g/dL — ABNORMAL LOW (ref 12.0–15.0)
Hemoglobin: 12.2 g/dL (ref 12.0–15.0)
Hemoglobin: 9.9 g/dL — ABNORMAL LOW (ref 12.0–15.0)
O2 Saturation: 100 %
O2 Saturation: 99 %
O2 Saturation: 99 %
Patient temperature: 36.6
Patient temperature: 36.9
Potassium: 3.3 mmol/L — ABNORMAL LOW (ref 3.5–5.1)
Potassium: 4.1 mmol/L (ref 3.5–5.1)
Potassium: 4.1 mmol/L (ref 3.5–5.1)
Sodium: 137 mmol/L (ref 135–145)
Sodium: 138 mmol/L (ref 135–145)
Sodium: 138 mmol/L (ref 135–145)
TCO2: 25 mmol/L (ref 22–32)
TCO2: 26 mmol/L (ref 22–32)
TCO2: 26 mmol/L (ref 22–32)
pCO2 arterial: 38.7 mmHg (ref 32.0–48.0)
pCO2 arterial: 39.5 mmHg (ref 32.0–48.0)
pCO2 arterial: 39.5 mmHg (ref 32.0–48.0)
pH, Arterial: 7.383 (ref 7.350–7.450)
pH, Arterial: 7.402 (ref 7.350–7.450)
pH, Arterial: 7.422 (ref 7.350–7.450)
pO2, Arterial: 128 mmHg — ABNORMAL HIGH (ref 83.0–108.0)
pO2, Arterial: 160 mmHg — ABNORMAL HIGH (ref 83.0–108.0)
pO2, Arterial: 511 mmHg — ABNORMAL HIGH (ref 83.0–108.0)

## 2021-04-26 LAB — GLUCOSE, CAPILLARY
Glucose-Capillary: 144 mg/dL — ABNORMAL HIGH (ref 70–99)
Glucose-Capillary: 162 mg/dL — ABNORMAL HIGH (ref 70–99)
Glucose-Capillary: 121 mg/dL — ABNORMAL HIGH (ref 70–99)
Glucose-Capillary: 149 mg/dL — ABNORMAL HIGH (ref 70–99)
Glucose-Capillary: 124 mg/dL — ABNORMAL HIGH (ref 70–99)
Glucose-Capillary: 158 mg/dL — ABNORMAL HIGH (ref 70–99)
Glucose-Capillary: 128 mg/dL — ABNORMAL HIGH (ref 70–99)
Glucose-Capillary: 132 mg/dL — ABNORMAL HIGH (ref 70–99)
Glucose-Capillary: 123 mg/dL — ABNORMAL HIGH (ref 70–99)
Glucose-Capillary: 147 mg/dL — ABNORMAL HIGH (ref 70–99)
Glucose-Capillary: 141 mg/dL — ABNORMAL HIGH (ref 70–99)
Glucose-Capillary: 126 mg/dL — ABNORMAL HIGH (ref 70–99)
Glucose-Capillary: 103 mg/dL — ABNORMAL HIGH (ref 70–99)

## 2021-04-26 LAB — POCT I-STAT, CHEM 8
BUN: 4 mg/dL — ABNORMAL LOW (ref 6–20)
Calcium, Ion: 1.2 mmol/L (ref 1.15–1.40)
Chloride: 101 mmol/L (ref 98–111)
Creatinine, Ser: 0.4 mg/dL — ABNORMAL LOW (ref 0.44–1.00)
Glucose, Bld: 115 mg/dL — ABNORMAL HIGH (ref 70–99)
HCT: 35 % — ABNORMAL LOW (ref 36.0–46.0)
Hemoglobin: 11.9 g/dL — ABNORMAL LOW (ref 12.0–15.0)
Potassium: 3.3 mmol/L — ABNORMAL LOW (ref 3.5–5.1)
Sodium: 138 mmol/L (ref 135–145)
TCO2: 25 mmol/L (ref 22–32)

## 2021-04-26 LAB — MAGNESIUM
Magnesium: 2.6 mg/dL — ABNORMAL HIGH (ref 1.7–2.4)
Magnesium: 2 mg/dL (ref 1.7–2.4)

## 2021-04-26 LAB — HEMOGLOBIN A1C
Hgb A1c MFr Bld: 5.1 % (ref 4.8–5.6)
Mean Plasma Glucose: 100 mg/dL

## 2021-04-26 LAB — ECHO INTRAOPERATIVE TEE: Weight: 2430.35 oz

## 2021-04-26 LAB — MRSA PCR SCREENING: MRSA by PCR: NEGATIVE

## 2021-04-26 MED ORDER — ORAL CARE MOUTH RINSE: 15.0000 mL | Freq: Two times a day (BID) | OROMUCOSAL | Status: DC

## 2021-04-26 MED ORDER — POTASSIUM CHLORIDE CRYS ER 20 MEQ PO TBCR
40.0000 meq | EXTENDED_RELEASE_TABLET | Freq: Once | ORAL | Status: AC
  Administered 2021-04-26: 40 meq via ORAL
  Filled 2021-04-26: qty 2

## 2021-04-26 MED ORDER — CLOPIDOGREL BISULFATE 75 MG PO TABS
75.0000 mg | ORAL_TABLET | Freq: Every day | ORAL | Status: DC
  Administered 2021-04-26 – 2021-04-29 (×4): 75 mg via ORAL
  Filled 2021-04-26 (×4): qty 1

## 2021-04-26 MED ORDER — THIAMINE HCL 100 MG/ML IJ SOLN
Freq: Once | INTRAVENOUS | Status: AC
  Filled 2021-04-26: qty 1000

## 2021-04-26 MED ORDER — KETOROLAC TROMETHAMINE 15 MG/ML IJ SOLN
7.5000 mg | Freq: Four times a day (QID) | INTRAMUSCULAR | Status: AC
  Administered 2021-04-26 – 2021-04-27 (×5): 7.5 mg via INTRAVENOUS
  Filled 2021-04-26 (×5): qty 1

## 2021-04-26 MED ORDER — INSULIN ASPART 100 UNIT/ML IJ SOLN
2.0000 [IU] | INTRAMUSCULAR | Status: DC
  Administered 2021-04-26 (×3): 2 [IU] via SUBCUTANEOUS

## 2021-04-26 MED ORDER — ASPIRIN EC 81 MG PO TBEC
81.0000 mg | DELAYED_RELEASE_TABLET | Freq: Every day | ORAL | Status: DC
  Administered 2021-04-26 – 2021-04-29 (×4): 81 mg via ORAL
  Filled 2021-04-26 (×4): qty 1

## 2021-04-26 NOTE — Progress Notes (Signed)
   04/26/21 1700  Clinical Encounter Type  Visited With Patient and family together  Visit Type Spiritual support  Referral From Nurse  Consult/Referral To [place]  The chaplain responded to spiritual consult for an advance directive. The chaplain provided education to patient and her husband Gerald Stabs). The patient decided she did not want to complete an advanced directive. The chaplain spoke with the patient about her condition and she shared what happened. The chaplain provided active listening and social support to the patient and her husband. The patient requested prayer. The chaplain prayed.

## 2021-04-26 NOTE — Progress Notes (Signed)
1 Day Post-Op Procedure(s) (LRB): CORONARY ARTERY BYPASS GRAFTING (CABG) times one using right greater saphenous vein harvested endoscopically (N/A) TRANSESOPHAGEAL ECHOCARDIOGRAM (TEE) (N/A) ENDOVEIN HARVEST OF GREATER SAPHENOUS VEIN (Right) Subjective: No complaints  Objective: Vital signs in last 24 hours: Temp:  [94.64 F (34.8 C)-99.14 F (37.3 C)] 98.78 F (37.1 C) (05/26 0800) Pulse Rate:  [63-91] 76 (05/26 0800) Cardiac Rhythm: Normal sinus rhythm (05/26 0800) Resp:  [10-25] 19 (05/26 0800) BP: (94-138)/(43-89) 103/57 (05/26 0800) SpO2:  [96 %-100 %] 99 % (05/26 0800) Arterial Line BP: (82-104)/(73-87) 99/87 (05/25 2215) FiO2 (%):  [40 %-50 %] 40 % (05/25 2317) Weight:  [68.9 kg-79.2 kg] 79.2 kg (05/26 0645)  Hemodynamic parameters for last 24 hours: PAP: (10-39)/(4-27) 27/12 CO:  [2.8 L/min-5.1 L/min] 5.1 L/min CI:  [1.6 L/min/m2-3 L/min/m2] 3 L/min/m2  Intake/Output from previous day: 05/25 0701 - 05/26 0700 In: 5383.9 [I.V.:3704.3; Blood:245; IV Piggyback:1434.7] Out: 2725 [Urine:2505; Chest Tube:220] Intake/Output this shift: Total I/O In: 134.1 [I.V.:116.3; IV Piggyback:17.9] Out: -   General appearance: alert and cooperative Neurologic: intact Heart: regular rate and rhythm, S1, S2 normal, no murmur, click, rub or gallop Lungs: clear to auscultation bilaterally Abdomen: soft, non-tender; bowel sounds normal; no masses,  no organomegaly Extremities: extremities normal, atraumatic, no cyanosis or edema Wound: c/d/i  Lab Results: Recent Labs    04/25/21 1956 04/25/21 1958 04/26/21 0044 04/26/21 0309  WBC 23.8*  --   --  19.6*  HGB 11.1*   < > 9.9* 10.2*  HCT 33.3*   < > 29.0* 30.2*  PLT 265  --   --  250   < > = values in this interval not displayed.   BMET:  Recent Labs    04/25/21 1426 04/25/21 1438 04/25/21 1833 04/25/21 1958 04/26/21 0044 04/26/21 0309  NA 134*   < > 138   < > 138 135  K 2.7*   < > 4.8   < > 4.1 4.0  CL 106   < > 104   --   --  106  CO2 19*  --   --   --   --  23  GLUCOSE 136*   < > 171*  --   --  124*  BUN 14   < > 11  --   --  9  CREATININE 0.66   < > 0.40*  --   --  0.62  CALCIUM 8.0*  --   --   --   --  7.2*   < > = values in this interval not displayed.    PT/INR:  Recent Labs    04/25/21 1956  LABPROT 15.6*  INR 1.2   ABG    Component Value Date/Time   PHART 7.402 04/26/2021 0044   HCO3 24.6 04/26/2021 0044   TCO2 26 04/26/2021 0044   ACIDBASEDEF 1.0 04/25/2021 2340   O2SAT 99.0 04/26/2021 0044   CBG (last 3)  Recent Labs    04/26/21 0507 04/26/21 0601 04/26/21 0700  GLUCAP 158* 128* 132*    Assessment/Plan: S/P Procedure(s) (LRB): CORONARY ARTERY BYPASS GRAFTING (CABG) times one using right greater saphenous vein harvested endoscopically (N/A) TRANSESOPHAGEAL ECHOCARDIOGRAM (TEE) (N/A) ENDOVEIN HARVEST OF GREATER SAPHENOUS VEIN (Right) Mobilize gentle resuscitation  D/c pa cath D/c arterial line DAPT   LOS: 1 day    Wonda Olds 04/26/2021

## 2021-04-26 NOTE — Op Note (Signed)
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  Murvin Natal, MD  Assistant: Leretha Pol, PA-C  Anesthesia: General  Operative Findings:  Preserved left ventricular systolic function  Good quality saphenous vein conduit  Good quality target vessels for grafting    BRIEF CLINICAL NOTE AND INDICATIONS FOR SURGERY  45 year old lady in usual state of good health developed chest pain and diaphoresis the morning of 04/25/2021.  She was diagnosed with STEMI after calling 911.  She underwent catheterization which demonstrated spontaneous coronary artery dissection which could not be reached opened.  She is taken the operating room urgently for CABG   DETAILS OF THE OPERATIVE PROCEDURE  Preparation:  The patient is brought to the operating room on the above mentioned date and central monitoring was established by the anesthesia team including placement of Swan-Ganz catheter and radial arterial line. The patient is placed in the supine position on the operating table.  Intravenous antibiotics are administered. General endotracheal anesthesia is induced uneventfully. A Foley catheter is placed.  Baseline transesophageal echocardiogram was performed.  Findings were notable for preserved LV function and mildly reduced RV function  The patient's chest, abdomen, both groins, and both lower extremities are prepared and draped in a sterile manner. A time out procedure is performed.   Surgical Approach and Conduit Harvest:  A median sternotomy incision was performed.  Simultaneously, the greater saphenous vein is obtained from the patient's right thigh using endoscopic vein  harvest technique. The saphenous vein is notably good quality conduit. After removal of the saphenous vein, the small surgical incisions in the lower extremity are closed with absorbable suture.    Extracorporeal Cardiopulmonary Bypass and Myocardial Protection:  The pericardium is opened. The ascending aorta is nondiseased in appearance. The ascending aorta and the right atrium are cannulated for cardiopulmonary bypass.  Full dose heparin is given intravenously.  Adequate heparinization is verified.     The entire pre-bypass portion of the operation was notable for stable hemodynamics and cardiac rhythm.  Cardiopulmonary bypass was begun and the surface of the heart is inspected. Distal target vessels are selected for coronary artery bypass grafting. A cardioplegia cannula is placed in the ascending aorta.   The patient is allowed to cool passively to 34C systemic temperature.  The aortic cross clamp is applied and cold blood cardioplegia is delivered initially in an antegrade fashion through the aortic root.   Iced saline slush is applied for topical hypothermia.  The initial cardioplegic arrest is rapid with early diastolic arrest.  Repeat doses of cardioplegia are administered intermittently throughout the entire cross clamp portion of the operation through the aortic root  and through subsequently placed vein grafts in order to maintain completely flat electrocardiogram.   Coronary Artery Bypass Grafting:   The posterior descending branch of the right coronary artery was grafted using a reversed saphenous vein graft in an end-to-side fashion.  At the site of distal anastomosis the target vessel was good quality and measured approximately 1.5 mm in diameter.  The proximal vein graft anastomoses was placed directly to the ascending aorta prior to removal of the aortic cross clamp.  A rewarming dose of cardioplegia was given down the aortic root and the aortic cross-clamp was  removed after  de-airing procedures were performed.  Procedure Completion:  All proximal and distal coronary anastomoses were inspected for hemostasis and appropriate graft orientation. Epicardial pacing wires are fixed to the right ventricular outflow tract and to the right atrial appendage. The patient is rewarmed to 37C temperature. The patient is weaned and disconnected from cardiopulmonary bypass.  The patient's rhythm at separation from bypass was sinus bradycardia.  The patient was weaned from cardiopulmonary bypass without any inotropic support.   Followup transesophageal echocardiogram performed after separation from bypass revealed no changes from the preoperative exam.  The aortic and venous cannula were removed uneventfully. Protamine was administered to reverse the anticoagulation. The mediastinum and pleural space were inspected for hemostasis and irrigated with saline solution. The mediastinal space was drained with fluted chest tubes placed through separate stab incisions inferiorly.  The soft tissues anterior to the aorta were reapproximated loosely. The sternum is closed with double strength sternal wire. The soft tissues anterior to the sternum were closed in multiple layers and the skin is closed with a running subcuticular skin closure.  The post-bypass portion of the operation was notable for stable rhythm and hemodynamics.  No blood products were administered during the operation.   Disposition:  The patient tolerated the procedure well and is transported to the surgical intensive care in stable condition. There are no intraoperative complications. All sponge instrument and needle counts are verified correct at completion of the operation.    Jayme Cloud, MD 04/26/2021 9:25 AM

## 2021-04-26 NOTE — Progress Notes (Signed)
Patient ID: Anna Vang, female   DOB: [birth year], 44 y.o.   MRN: [id number]  TCTS Evening Rounds:   Hemodynamically stable    Urine output good  CT output low  CBC    Component Value Date/Time   WBC 19.6 (H) 04/26/2021 0309   RBC 3.20 (L) 04/26/2021 0309   HGB 11.9 (L) 04/26/2021 1035   HCT 35.0 (L) 04/26/2021 1035   PLT 250 04/26/2021 0309   MCV 94.4 04/26/2021 0309   MCH 31.9 04/26/2021 0309   MCHC 33.8 04/26/2021 0309   RDW 12.4 04/26/2021 0309     BMET    Component Value Date/Time   NA 138 04/26/2021 1035   K 3.3 (L) 04/26/2021 1035   CL 101 04/26/2021 1035   CO2 23 04/26/2021 0309   GLUCOSE 115 (H) 04/26/2021 1035   BUN 4 (L) 04/26/2021 1035   CREATININE 0.40 (L) 04/26/2021 1035   CALCIUM 7.2 (L) 04/26/2021 0309   GFRNONAA >60 04/26/2021 0309     A/P:  Stable postop course. Continue current plans. Replace K+.

## 2021-04-26 NOTE — Plan of Care (Signed)
  Problem: Education: Goal: Knowledge of General Education information will improve Description: Including pain rating scale, medication(s)/side effects and non-pharmacologic comfort measures Outcome: Progressing   Problem: Health Behavior/Discharge Planning: Goal: Ability to manage health-related needs will improve Outcome: Progressing   Problem: Clinical Measurements: Goal: Ability to maintain clinical measurements within normal limits will improve Outcome: Progressing Goal: Will remain free from infection Outcome: Progressing Goal: Diagnostic test results will improve Outcome: Progressing Goal: Respiratory complications will improve Outcome: Progressing Goal: Cardiovascular complication will be avoided Outcome: Progressing   Problem: Activity: Goal: Risk for activity intolerance will decrease Outcome: Progressing   Problem: Nutrition: Goal: Adequate nutrition will be maintained Outcome: Progressing   Problem: Coping: Goal: Level of anxiety will decrease Outcome: Progressing   Problem: Elimination: Goal: Will not experience complications related to bowel motility Outcome: Progressing Goal: Will not experience complications related to urinary retention Outcome: Progressing   Problem: Pain Managment: Goal: General experience of comfort will improve Outcome: Progressing   Problem: Safety: Goal: Ability to remain free from injury will improve Outcome: Progressing   Problem: Skin Integrity: Goal: Risk for impaired skin integrity will decrease Outcome: Progressing   Problem: Education: Goal: Will demonstrate proper wound care and an understanding of methods to prevent future damage Outcome: Progressing Goal: Knowledge of disease or condition will improve Outcome: Progressing Goal: Knowledge of the prescribed therapeutic regimen will improve Outcome: Progressing   Problem: Activity: Goal: Risk for activity intolerance will decrease Outcome: Progressing    Problem: Cardiac: Goal: Will achieve and/or maintain hemodynamic stability Outcome: Progressing   Problem: Clinical Measurements: Goal: Postoperative complications will be avoided or minimized Outcome: Progressing   Problem: Skin Integrity: Goal: Wound healing without signs and symptoms of infection Outcome: Progressing Goal: Risk for impaired skin integrity will decrease Outcome: Progressing   Problem: Urinary Elimination: Goal: Ability to achieve and maintain adequate renal perfusion and functioning will improve Outcome: Progressing

## 2021-04-26 NOTE — Progress Notes (Signed)
Received patient around 1400 form Misha RN. Pt stable off pressors, on RA. Chest tubes, foley, introducer, epicardial wires present on a banana bag MIV. Pt denies pain at rest, surgical chest pain moderate with ambulation. Able to walk short distance in hall and sit in chair for dinner. Ambulated 1/2 lap around unit after dinner. Held Tylenol for cumulative overdose warning, scheduled ketorolac given. Low UOP this shift, total of 316mL clear yellow. Family at bedside.   Joell Usman RN

## 2021-04-26 NOTE — Addendum Note (Signed)
Addendum  created 04/26/21 4650 by Josephine Igo, CRNA   Order list changed, Pharmacy for encounter modified

## 2021-04-27 ENCOUNTER — Inpatient Hospital Stay (HOSPITAL_COMMUNITY): Payer: 59

## 2021-04-27 DIAGNOSIS — Z8249 Family history of ischemic heart disease and other diseases of the circulatory system: Secondary | ICD-10-CM | POA: Diagnosis not present

## 2021-04-27 DIAGNOSIS — I2111 ST elevation (STEMI) myocardial infarction involving right coronary artery: Secondary | ICD-10-CM | POA: Diagnosis not present

## 2021-04-27 DIAGNOSIS — Z20822 Contact with and (suspected) exposure to covid-19: Secondary | ICD-10-CM | POA: Diagnosis not present

## 2021-04-27 DIAGNOSIS — I2542 Coronary artery dissection: Secondary | ICD-10-CM | POA: Diagnosis not present

## 2021-04-27 DIAGNOSIS — J90 Pleural effusion, not elsewhere classified: Secondary | ICD-10-CM | POA: Diagnosis not present

## 2021-04-27 DIAGNOSIS — E8770 Fluid overload, unspecified: Secondary | ICD-10-CM | POA: Diagnosis not present

## 2021-04-27 DIAGNOSIS — E876 Hypokalemia: Secondary | ICD-10-CM | POA: Diagnosis not present

## 2021-04-27 DIAGNOSIS — D62 Acute posthemorrhagic anemia: Secondary | ICD-10-CM | POA: Diagnosis not present

## 2021-04-27 DIAGNOSIS — I517 Cardiomegaly: Secondary | ICD-10-CM | POA: Diagnosis not present

## 2021-04-27 LAB — BASIC METABOLIC PANEL
Anion gap: 4 — ABNORMAL LOW (ref 5–15)
BUN: 9 mg/dL (ref 6–20)
CO2: 25 mmol/L (ref 22–32)
Calcium: 7 mg/dL — ABNORMAL LOW (ref 8.9–10.3)
Chloride: 98 mmol/L (ref 98–111)
Creatinine, Ser: 0.54 mg/dL (ref 0.44–1.00)
GFR, Estimated: >60 mL/min (ref >60)
Glucose, Bld: 111 mg/dL — ABNORMAL HIGH (ref 70–99)
Potassium: 3.9 mmol/L (ref 3.5–5.1)
Sodium: 127 mmol/L — ABNORMAL LOW (ref 135–145)

## 2021-04-27 LAB — CBC
HCT: 24.3 % — ABNORMAL LOW (ref 36.0–46.0)
Hemoglobin: 8.2 g/dL — ABNORMAL LOW (ref 12.0–15.0)
MCH: 31.9 pg (ref 26.0–34.0)
MCHC: 33.7 g/dL (ref 30.0–36.0)
MCV: 94.6 fL (ref 80.0–100.0)
Platelets: 176 10*3/uL (ref 150–400)
RBC: 2.57 MIL/uL — ABNORMAL LOW (ref 3.87–5.11)
RDW: 12.7 % (ref 11.5–15.5)
WBC: 13.6 10*3/uL — ABNORMAL HIGH (ref 4.0–10.5)
nRBC: 0 % (ref 0.0–0.2)

## 2021-04-27 LAB — GLUCOSE, CAPILLARY
Glucose-Capillary: 115 mg/dL — ABNORMAL HIGH (ref 70–99)
Glucose-Capillary: 104 mg/dL — ABNORMAL HIGH (ref 70–99)
Glucose-Capillary: 104 mg/dL — ABNORMAL HIGH (ref 70–99)
Glucose-Capillary: 117 mg/dL — ABNORMAL HIGH (ref 70–99)

## 2021-04-27 MED ORDER — SODIUM CHLORIDE 0.9 % IV SOLN: 250.0000 mL | INTRAVENOUS | Status: DC | PRN

## 2021-04-27 MED ORDER — ENOXAPARIN SODIUM 40 MG/0.4ML IJ SOSY
40.0000 mg | PREFILLED_SYRINGE | INTRAMUSCULAR | Status: DC
Start: 2021-04-27 — End: 2021-04-29
  Administered 2021-04-27 – 2021-04-28 (×2): 40 mg via SUBCUTANEOUS
  Filled 2021-04-27 (×2): qty 0.4

## 2021-04-27 MED ORDER — ~~LOC~~ CARDIAC SURGERY, PATIENT & FAMILY EDUCATION: Freq: Once | Status: AC

## 2021-04-27 MED ORDER — SODIUM CHLORIDE 0.9% FLUSH: 3.0000 mL | INTRAVENOUS | Status: DC | PRN

## 2021-04-27 MED ORDER — DOCUSATE SODIUM 100 MG PO CAPS
200.0000 mg | ORAL_CAPSULE | Freq: Every day | ORAL | Status: DC
  Filled 2021-04-27 (×2): qty 2

## 2021-04-27 MED ORDER — INSULIN ASPART 100 UNIT/ML IJ SOLN: 0.0000 [IU] | Freq: Three times a day (TID) | INTRAMUSCULAR | Status: DC

## 2021-04-27 MED ORDER — INSULIN ASPART 100 UNIT/ML IJ SOLN: 2.0000 [IU] | Freq: Three times a day (TID) | INTRAMUSCULAR | Status: DC

## 2021-04-27 MED ORDER — SODIUM CHLORIDE 0.9% FLUSH
3.0000 mL | Freq: Two times a day (BID) | INTRAVENOUS | Status: DC
  Administered 2021-04-27 – 2021-04-29 (×5): 3 mL via INTRAVENOUS

## 2021-04-27 MED FILL — Sodium Chloride IV Soln 0.9%: INTRAVENOUS | Qty: 2000 | Status: AC

## 2021-04-27 MED FILL — Sodium Bicarbonate IV Soln 8.4%: INTRAVENOUS | Qty: 50 | Status: AC

## 2021-04-27 MED FILL — Potassium Chloride Inj 2 mEq/ML: INTRAVENOUS | Qty: 40 | Status: AC

## 2021-04-27 MED FILL — Heparin Sodium (Porcine) Inj 1000 Unit/ML: INTRAMUSCULAR | Qty: 30 | Status: AC

## 2021-04-27 MED FILL — Lidocaine HCl Local Preservative Free (PF) Inj 2%: INTRAMUSCULAR | Qty: 15 | Status: AC

## 2021-04-27 MED FILL — Electrolyte-R (PH 7.4) Solution: INTRAVENOUS | Qty: 4000 | Status: AC

## 2021-04-27 NOTE — Progress Notes (Signed)
Mobility Specialist - Progress Note   04/27/21 1759  Mobility  Activity Ambulated in hall  Level of Assistance Standby assist, set-up cues, supervision of patient - no hands on  Assistive Device Front wheel walker  Distance Ambulated (ft) 140 ft  Mobility Ambulated with assistance in hallway  Mobility Response Tolerated well  Mobility performed by Mobility specialist  $Mobility charge 1 Mobility   Pt took one brief standing rest break due to 1/4 DOE, SpO2 was 100% on RA at that time. Pt back in bed after walk, call bell at side. VSS throughout.   Pricilla Handler Mobility Specialist Mobility Specialist Phone: [phone]

## 2021-04-27 NOTE — Progress Notes (Signed)
Anesthesiology Follow-up:  45 year old female two days S/P CABG X 1 for spontaneous RCA dissection. Now awake and alert, neuro intact, hemodynamically stable, taking PO liquids, ambulating with assistance.  VS: T- 36.9 BP- 109/43 HR- 70 (SR) RR- 20 O2 Sat 98% on RA  Na- 127 K- 3.9 BUN/Cr.- 9/0.54 glucose- 111 H/H- 8.2/24.3 Platelets- 176,000  Extubated 4 hours post-op. Doing well. No apparent complications.  Roberts Gaudy

## 2021-04-27 NOTE — Progress Notes (Addendum)
Pt arrived from [place], VSS. Tele box 4E09, oriented to unit, CHG complete.   Chrisandra Carota, RN\ 04/27/2021 2:27 PM

## 2021-04-27 NOTE — Discharge Instructions (Signed)
[facility] office [phone]   Endoscopic Saphenous Vein Harvesting, Care After This sheet gives you information about how to care for yourself after your procedure. Your health care provider may also give you more specific instructions. If you have problems or questions, contact your health care provider. What can I expect after the procedure? After the procedure, it is common to have:  Pain.  Bruising.  Swelling.  Numbness. Follow these instructions at home: Incision care  Follow instructions from your health care provider about how to take care of your incisions. Make sure you: ? Wash your hands with soap and water before and after you change your bandages (dressings). If soap and water are not available, use hand sanitizer. ? Change your dressings as told by your health care provider. ? Leave stitches (sutures), skin glue, or adhesive strips in place. These skin closures may need to stay in place for 2 weeks or longer. If adhesive strip edges start to loosen and curl up, you may trim the loose edges. Do not remove adhesive strips completely unless your health care provider tells you to do that.  Check your incision areas every day for signs of infection. Check for: ? More redness, swelling, or pain. ? Fluid or blood. ? Warmth. ? Pus or a bad smell.   Medicines  Take over-the-counter and prescription medicines only as told by your health care provider.  Ask your health care provider if the medicine prescribed to you requires you to avoid driving or using heavy machinery. General instructions  Raise (elevate) your legs above the level of your heart while you are sitting or lying down.  Avoid crossing your legs.  Avoid sitting for long periods of time. Change positions every 30 minutes.  Do any exercises your health care providers have given you. These may include deep breathing, coughing, and walking exercises.  Do not take baths, swim, or use a hot tub until your health care  provider approves. Ask your health care provider if you may take showers. You may only be allowed to take sponge baths.  Wear compression stockings as told by your health care provider. These stockings help to prevent blood clots and reduce swelling in your legs.  Keep all follow-up visits as told by your health care provider. This is important. Contact a health care provider if:  Medicine does not help your pain.  Your pain gets worse.  You have new leg bruises or your leg bruises get bigger.  Your leg feels numb.  You have more redness, swelling, or pain around your incision.  You have fluid or blood coming from your incision.  Your incision feels warm to the touch.  You have pus or a bad smell coming from your incision.  You have a fever. Get help right away if:  Your pain is severe.  You develop pain, tenderness, warmth, redness, or swelling in any part of your leg.  You have chest pain.  You have trouble breathing. Summary  Raise (elevate) your legs above the level of your heart while you are sitting or lying down.  Wear compression stockings as told by your health care provider.  Make sure you know which symptoms should prompt you to contact your health care provider.  Keep all follow-up visits as told by your health care provider. This information is not intended to replace advice given to you by your health care provider. Make sure you discuss any questions you have with your health care provider. Document Revised: 10/26/2018  Document Reviewed: 10/26/2018 Elsevier Patient Education  2021 [employer]. Coronary Artery Bypass Grafting, Care After This sheet gives you information about how to care for yourself after your procedure. Your doctor may also give you more specific instructions. If you have problems or questions, call your doctor. What can I expect after the procedure? After the procedure, it is common to:  Feel sick to your stomach (nauseous).  Not  want to eat as much as normal (lack of appetite).  Have trouble pooping (constipation).  Have weakness and tiredness (fatigue).  Feel sad (depressed) or grouchy (irritable).  Have pain or discomfort around the cuts from surgery (incisions). Follow these instructions at home: Medicines  Take over-the-counter and prescription medicines only as told by your doctor. Do not stop taking medicines or start any new medicines unless your doctor says it is okay.  If you were prescribed an antibiotic medicine, take it as told by your doctor. Do not stop taking the antibiotic even if you start to feel better. Incision care  Follow instructions from your doctor about how to take care of your cuts from surgery. Make sure you: ? Wash your hands with soap and water before and after you change your bandage (dressing). If you cannot use soap and water, use hand sanitizer. ? Change your bandage as told by your doctor. ? Leave stitches (sutures), skin glue, or skin tape (adhesive) strips in place. They may need to stay in place for 2 weeks or longer. If tape strips get loose and curl up, you may trim the loose edges. Do not remove tape strips completely unless your doctor says it is okay.  Make sure the surgery cuts are clean, dry, and protected.  Check your cut areas every day for signs of infection. Check for: ? More redness, swelling, or pain. ? More fluid or blood. ? Warmth. ? Pus or a bad smell.  If cuts were made in your legs: ? Avoid crossing your legs. ? Avoid sitting for long periods of time. Change positions every 30 minutes. ? Raise (elevate) your legs when you are sitting.   Bathing  You may shower.  Clean the incisions gently with soap and water.     Pat the surgery cuts dry. Do not rub the cuts to dry.  Ask your doctor when you can shower. Eating and drinking  Eat foods that are high in fiber, such as beans, nuts, whole grains, and raw fruits and vegetables. Any meats you eat  should be lean cut. Avoid canned, processed, and fried foods. This can help prevent trouble pooping. This is also a part of a heart-healthy diet.  Drink enough fluid to keep your pee (urine) pale yellow.  Do not drink alcohol until you are fully recovered. Ask your doctor when it is safe to drink alcohol.   Activity  Rest and limit your activity as told by your doctor. You may be told to: ? Stop any activity right away if you have chest pain, shortness of breath, irregular heartbeats, or dizziness. Get help right away if you have any of these symptoms. ? Move around often for short periods or take short walks as told by your doctor. Slowly increase your activities. ? Avoid lifting, pushing, or pulling anything that is heavier than 10 lb (4.5 kg) for at least 6 weeks or as told by your doctor.  Do physical therapy or a cardiac rehab (cardiac rehabilitation) program as told by your doctor. ? Physical therapy involves doing exercises to  maintain movement and build strength and endurance. ? A cardiac rehab program includes:  Exercise training.  Education.  Counseling.  Do not drive until your doctor says it is okay.  Ask your doctor when you can go back to work.  Ask your doctor when you can be sexually active. General instructions  Do not drive or use heavy machinery while taking prescription pain medicine.  Do not use any products that contain nicotine or tobacco. These include cigarettes, e-cigarettes, and chewing tobacco. If you need help quitting, ask your doctor.  Take 2-3 deep breaths every few hours during the day while you get better. This helps expand your lungs and prevent problems.  If you were given a device called an incentive spirometer, use it several times a day to practice deep breathing. Support your chest with a pillow or your arms when you take deep breaths or cough.  Wear compression stockings as told by your doctor.  Weigh yourself every day. This helps to  see if your body is holding (retaining) fluid that may make your heart and lungs work harder.  Keep all follow-up visits as told by your doctor. This is important. Contact a doctor if:  You have more redness, swelling, or pain around any cut.  You have more fluid or blood coming from any cut.  Any cut feels warm to the touch.  You have pus or a bad smell coming from any cut.  You have a fever.  You have swelling in your ankles or legs.  You have pain in your legs.  You gain 2 lb (0.9 kg) or more a day.  You feel sick to your stomach or you throw up (vomit).  You have watery poop (diarrhea). Get help right away if:  You have chest pain that goes to your jaw or arms.  You are short of breath.  You have a fast or irregular heartbeat.  You notice a "clicking" in your breastbone (sternum) when you move.  You have any signs of a stroke. "BE FAST" is an easy way to remember the main warning signs: ? B - Balance. Signs are dizziness, sudden trouble walking, or loss of balance. ? E - Eyes. Signs are trouble seeing or a change in how you see. ? F - Face. Signs are sudden weakness or loss of feeling of the face, or the face or eyelid drooping on one side. ? A - Arms. Signs are weakness or loss of feeling in an arm. This happens suddenly and usually on one side of the body. ? S - Speech. Signs are sudden trouble speaking, slurred speech, or trouble understanding what people say. ? T - Time. Time to call emergency services. Write down what time symptoms started.  You have other signs of a stroke, such as: ? A sudden, very bad headache with no known cause. ? Feeling sick to your stomach. ? Throwing up. ? Jerky movements you cannot control (seizure). These symptoms may be an emergency. Do not wait to see if the symptoms will go away. Get medical help right away. Call your local emergency services (911 in the [place] Do not drive yourself to the hospital. Summary  After the  procedure, it is common to have pain or discomfort in the cuts from surgery (incisions).  Do not take baths, swim, or use a hot tub until your doctor says it is okay.  Slowly increase your activities. You may need physical therapy or cardiac rehab.  Weigh yourself every day.  This helps to see if your body is holding fluid. This information is not intended to replace advice given to you by your health care provider. Make sure you discuss any questions you have with your health care provider. Document Revised: 07/28/2018 Document Reviewed: 07/28/2018 Elsevier Patient Education  2021 [employer].

## 2021-04-27 NOTE — Progress Notes (Signed)
Received report from [facility] stating that the pt pacing mode was turned off. Discovered that pacing mode was still on and set at 60 AAI. Patient HR currently is 85 pacing on her own. Turned pacing box off based on this assessment. Passed report on to nightshift nurse and initiated pacing protocol as ordered. Pt is stable.   Chrisandra Carota, RN 04/27/2021 7:39 PM

## 2021-04-27 NOTE — Progress Notes (Addendum)
[facility] DAILY ICU PROGRESS NOTE                   [address]            [place] [address]          [phone]   2 Days Post-Op Procedure(s) (LRB): CORONARY ARTERY BYPASS GRAFTING (CABG) times one using right greater saphenous vein harvested endoscopically (N/A) TRANSESOPHAGEAL ECHOCARDIOGRAM (TEE) (N/A) ENDOVEIN HARVEST OF GREATER SAPHENOUS VEIN (Right)  Total Length of Stay:  LOS: 2 days   Subjective:  Awake and alert, pain control is adequate with oral Tylenol and tramadol. Patient walked in the hall this morning with the rehab team.. O2 saturations acceptable on room air.  Passing gas, no bowel movement yet.   Objective: Vital signs in last 24 hours: Temp:  [97.8 F (36.6 C)-99.1 F (37.3 C)] 98.4 F (36.9 C) (05/27 0659) Pulse Rate:  [70-82] 72 (05/27 0400) Cardiac Rhythm: Normal sinus rhythm (05/27 0900) Resp:  [17-31] 21 (05/27 0900) BP: (91-120)/(33-99) 114/51 (05/27 0900) SpO2:  [97 %-100 %] 98 % (05/27 0400) Weight:  [83 kg] 83 kg (05/27 0537)  Filed Weights   04/26/21 0645 04/27/21 0537  Weight: 79.2 kg 83 kg    Weight change: 3.8 kg     Intake/Output from previous day: 05/26 0701 - 05/27 0700 In: 1569 [P.O.:120; I.V.:1143.3; IV Piggyback:305.7] Out: 945 [Urine:705; Chest Tube:240]  Intake/Output this shift: No intake/output data recorded.  Current Meds: Scheduled Meds: . acetaminophen  1,000 mg Oral Q6H   Or  . acetaminophen (TYLENOL) oral liquid 160 mg/5 mL  1,000 mg Per Tube Q6H  . aspirin EC  81 mg Oral Daily  . bisacodyl  10 mg Oral Daily   Or  . bisacodyl  10 mg Rectal Daily  . Chlorhexidine Gluconate Cloth  6 each Topical Daily  . clopidogrel  75 mg Oral Daily  . docusate sodium  200 mg Oral Daily  . folic acid  517 mcg Oral Daily  . insulin aspart  2-6 Units Subcutaneous Q4H  . mouth rinse  15 mL Mouth Rinse BID  . metoprolol tartrate  12.5 mg Oral BID   Or  . metoprolol tartrate  12.5 mg Per Tube BID  .  multivitamin with minerals  1 tablet Oral Daily  . pantoprazole  40 mg Oral Daily  . sodium chloride flush  10-40 mL Intracatheter Q12H  . sodium chloride flush  3 mL Intravenous Q12H   Continuous Infusions: . sodium chloride    .  ceFAZolin (ANCEF) IV 2 g (04/27/21 0918)  . dexmedetomidine (PRECEDEX) IV infusion Stopped (04/25/21 2235)  . electrolyte-A Stopped (04/26/21 0825)  . lactated ringers    . lactated ringers Stopped (04/26/21 0825)  . nitroGLYCERIN Stopped (04/25/21 2330)  . phenylephrine (NEO-SYNEPHRINE) Adult infusion Stopped (04/26/21 0752)   PRN Meds:.Place/Maintain arterial line **AND** sodium chloride, metoprolol tartrate, morphine injection, ondansetron (ZOFRAN) IV, oxyCODONE, sodium chloride flush, sodium chloride flush, traMADol  General appearance: alert, cooperative, no distress and Feeling tired after the walk Neurologic: intact Heart: Stable sinus rhythm, few PVCs. Lungs: Breath sounds are clear to auscultation anterior.  Chest x-ray shows trace pleural effusions and some left lower lobe atelectasis.  Chest tube drainage was only 90 mL for the past 12 hours. Abdomen: Soft and nontender Extremities: Mild peripheral edema, all extremities are well perfused.  The right lower extremity EVH incision is intact and dry Wound: The sternotomy incision is covered with a  dry Aquacel dressing.  Lab Results: CBC: Recent Labs    04/26/21 1700 04/27/21 0313  WBC 13.9* 13.6*  HGB 8.4* 8.2*  HCT 25.1* 24.3*  PLT 188 176   BMET:  Recent Labs    04/26/21 1700 04/27/21 0313  NA 125* 127*  K 3.2* 3.9  CL 98 98  CO2 21* 25  GLUCOSE 123* 111*  BUN 8 9  CREATININE 0.61 0.54  CALCIUM 6.8* 7.0*    CMET: Lab Results  Component Value Date   WBC 13.6 (H) 04/27/2021   HGB 8.2 (L) 04/27/2021   HCT 24.3 (L) 04/27/2021   PLT 176 04/27/2021   GLUCOSE 111 (H) 04/27/2021   CHOL 265 (H) 04/25/2021   TRIG 407 (H) 04/25/2021   HDL 39 (L) 04/25/2021   LDLDIRECT 173.7 (H)  04/25/2021   LDLCALC UNABLE TO CALCULATE IF TRIGLYCERIDE OVER 400 mg/dL 04/25/2021   ALT 24 04/25/2021   AST 26 04/25/2021   NA 127 (L) 04/27/2021   K 3.9 04/27/2021   CL 98 04/27/2021   CREATININE 0.54 04/27/2021   BUN 9 04/27/2021   CO2 25 04/27/2021   TSH 0.83 01/13/2017   INR 1.2 04/25/2021   HGBA1C 5.1 04/25/2021      PT/INR:  Recent Labs    04/25/21 1956  LABPROT 15.6*  INR 1.2   Radiology: [facility] Chest Port 1 View  Result Date: 04/27/2021 CLINICAL DATA:  Coronary bypass EXAM: PORTABLE CHEST 1 VIEW COMPARISON:  04/26/2021 FINDINGS: Swan-Ganz catheter removed. Right IJ vascular sheath remains, upper SVC level. Stable cardiomegaly with trace pleural effusions and increased left lower lobe atelectasis/consolidation obscuring the left hemidiaphragm. Median sternotomy wires noted. Trachea midline. No pneumothorax. IMPRESSION: Cardiomegaly with trace pleural effusions and increased left lower lobe atelectasis/consolidation Negative for pneumothorax. Electronically Signed   By: Jerilynn Mages.  Shick M.D.   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  She is on room air and ambulating in the hall.  No significant arrhythmias.  Continue low-dose metoprolol and dual antiplatelet therapy.  We will remove the chest tubes today.  Transfer to 4E .  -Expected acute blood loss anemia-hematocrit is 24% and down slightly from yesterday.  She is tolerating this well.  Add iron supplement. Continue to monitor.  -Volume excess-we do not have an accurate pre-op weight but she has some lower extremity edema.  Blood pressure has been  relatively low today, will hold off on diuresis for now.  -DVT prophylaxis-continue ambulation, add daily enoxaparin  Malon Kindle [phone]  04/27/2021 10:25 AM   Patient seen and examined, agree with above   Remo Lipps C. Roxan Hockey, MD [facility] [phone]

## 2021-04-27 NOTE — Addendum Note (Signed)
Addendum  created 04/27/21 0734 by Roberts Gaudy, MD   Clinical Note Signed

## 2021-04-27 NOTE — Plan of Care (Signed)
  Problem: Clinical Measurements: Goal: Ability to maintain clinical measurements within normal limits will improve Outcome: Progressing Goal: Will remain free from infection Outcome: Progressing Goal: Diagnostic test results will improve Outcome: Progressing Goal: Respiratory complications will improve Outcome: Progressing Goal: Cardiovascular complication will be avoided Outcome: Progressing   Problem: Activity: Goal: Risk for activity intolerance will decrease Outcome: Progressing   Problem: Nutrition: Goal: Adequate nutrition will be maintained Outcome: Progressing   Problem: Coping: Goal: Level of anxiety will decrease Outcome: Progressing   Problem: Elimination: Goal: Will not experience complications related to bowel motility Outcome: Progressing Goal: Will not experience complications related to urinary retention Outcome: Progressing   Problem: Pain Managment: Goal: General experience of comfort will improve Outcome: Progressing   Problem: Safety: Goal: Ability to remain free from injury will improve Outcome: Progressing   Problem: Skin Integrity: Goal: Risk for impaired skin integrity will decrease Outcome: Progressing   Problem: Activity: Goal: Risk for activity intolerance will decrease Outcome: Progressing   Problem: Cardiac: Goal: Will achieve and/or maintain hemodynamic stability Outcome: Progressing   Problem: Clinical Measurements: Goal: Postoperative complications will be avoided or minimized Outcome: Progressing

## 2021-04-27 NOTE — Discharge Summary (Signed)
[address]       ,[place] [address]             [phone]    Physician Discharge Summary  Patient ID: Anna Vang MRN: [id number] DOB/AGE: 1976-02-23 45 y.o.  Admit date: 04/25/2021 Discharge date: 04/29/2021  Admission Diagnoses:  Patient Active Problem List   Diagnosis Date Noted  . STEMI involving right coronary artery ([place]) 04/25/2021  . Spontaneous dissection of coronary artery 04/25/2021  . S/P CABG x 1 04/25/2021     Discharge Diagnoses:  Patient Active Problem List   Diagnosis Date Noted  . STEMI involving right coronary artery ([place]) 04/25/2021  . Spontaneous dissection of coronary artery 04/25/2021  . S/P CABG x 1 04/25/2021     Discharged Condition: stable  History of Present Illness:  Patient is a 45 yo female with no significant PMH.  The patient was in her usual state of health today when she got out of the shower and started to dry her hair.  She developed sudden onset centralized chest pain, nausea, diaphoresis, and was grey in color.  She thought she might pass out.  Her husband called EMS.  EKG obtained by EMS showed ST elevation in the inferior leads.  They initiated code STEMI and she was treated with ASA and Morphine prior to arrival to the hospital.  Repeat EKG in ED showed improvement of ST elevation.  Her chest pain persisted but was rated as 4/10.  She was taken directly to the cath lab where she was noted to have spontaneous dissection of the RCA.  Balloon angioplasty with PCI was attempted but flow was unable to be restored.  Emergent cardiothoracic consultation was requested.  She was evaluated by Dr. Orvan Seen who was in agreement the patient would benefit from emergent revascularization.  The risks and benefits of the procedure were explained to the patient and she was agreeable to proceed.  Hospital Course:  Patient was taken directly to the operating room.  She underwent CABG x 1 utilizing SVG to RCA.  She underwent  endoscopic harvest of greater saphenous vein from her right thigh.  She tolerated the procedure without difficulty and was taken to the SICU in stable condition.  She remained hemodynamically stable.  She was weaned from the ventilator and extubated without difficulty by 12 AM.  The monitoring lines were removed on the first postoperative day and she was mobilized with the cardiac rehab team.  She progressed well.  The oxygen was weaned off without difficulty.  By the second postoperative day, the chest tube drainage had tapered off so the chest tubes were removed along with the Foley catheter and the right IJ introducer.  She was transferred to 4 E.  Diuretics were not initiated at this point due to marginal blood pressure.  Consults: Cardiology  Significant Diagnostic Studies: angiography:   1.  Widely patent left main, LAD, and left circumflex 2.  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  PCI is attempted but is unsuccessful because of extension of the coronary dissection with balloon angioplasty and inability to restore flow into the vessel.  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  Murvin Natal, MD  Assistant:       Leretha Pol, PA-C  Discharge Exam: Blood pressure (!) 116/51, pulse 73, temperature 98.2 F (36.8 C), temperature source Oral, resp. rate 20, height 5\' 2"  (1.575 m), weight 77.6 kg, SpO2 100 %. Cardiovascular: RRR Pulmonary: Slightly diminished bibasilar breath sounds Abdomen: Soft, non tender, bowel sounds  present. Extremities:Trace bilateral lower extremity edema. Mild LUE ecchymosis (from a line). Motor/sensory intact and hand warm Wound: Sternal wound is clean and dry.  No erythema or signs of infection.   Discharge Medications:  The patient has been discharged on:   1.Beta Blocker:  Yes [ X  ]                              No   [   ]                              If No, reason:  2.Ace Inhibitor/ARB: Yes [   ]                                     No  [ x   ]                                     If No, reason:Labile BP.   3.Statin:   Yes [ x  ]                  No  [   ]                  If No, reason:  4.Ecasa:  Yes  [ X  ]                  No   [   ]                  If No, reason:    Discharge Instructions    Amb Referral to Cardiac Rehabilitation   Complete by: As directed    Diagnosis:  CABG STEMI     CABG X ___: 1   After initial evaluation and assessments completed: Virtual Based Care may be provided alone or in conjunction with Phase 2 Cardiac Rehab based on patient barriers.: Yes     Allergies as of 04/29/2021   No Known Allergies     Medication List    TAKE these medications   aspirin 81 MG EC tablet Take 1 tablet (81 mg total) by mouth daily. Swallow whole.   atorvastatin 40 MG tablet Commonly known as: LIPITOR Take 1 tablet (40 mg total) by mouth at bedtime.   cetirizine 10 MG tablet Commonly known as: ZYRTEC Take 10 mg by mouth daily as needed for allergies.   clopidogrel 75 MG tablet Commonly known as: PLAVIX Take 1 tablet (75 mg total) by mouth daily.   folic acid 540 MCG tablet Commonly known as: FOLVITE Take 400  mcg by mouth daily.   furosemide 40 MG tablet Commonly known as: LASIX Take 1 tablet (40 mg total) by mouth daily. For 3 days then stop   magnesium 30 MG tablet Take 30 mg by mouth daily.   metoprolol tartrate 25 MG tablet Commonly known as: LOPRESSOR Take 0.5 tablets (12.5 mg total) by mouth 2 (two) times daily.    multivitamin with minerals tablet Take 1 tablet by mouth daily.   oxyCODONE 5 MG immediate release tablet Commonly known as: Oxy IR/ROXICODONE Take 1 tablet (5 mg total) by mouth every 4 (four) hours as needed for severe pain.   potassium chloride SA 20 MEQ tablet Commonly known as: KLOR-CON Take 1 tablet (20 mEq total) by mouth daily. For 3 days then stop.   PROBIOTIC PO Take 1 capsule by mouth daily.   VITAMIN C PO Take 1 tablet by mouth daily.   VITAMIN D PO Take 1 tablet by mouth daily.       Follow-up Information    Sherren Mocha, MD Follow up on 05/16/2021.   Specialty: Cardiology Why: Please arrive 15 minutes early for your 3pm post-hospital cardiology appointment Contact information: [address] [address] [phone]        Wonda Olds, MD Follow up.   Specialty: Cardiothoracic Surgery Why: Please see discharge paperwork for follow-up appointment with surgeon. Contact information: [address] [place] [address] [phone]        [facility]. Go on 05/08/2021.   Specialty: Cardiothoracic Surgery Why: Appointment is with nurse for chest tube suture removal. Office will call with appointment time Contact information: [address] [place], [place] [phone]              Signed: Nani Skillern, Hershal Coria 04/29/2021, 9:41 AM

## 2021-04-27 NOTE — Consult Note (Signed)
   [facility] Inpatient Consult   04/27/2021  SARAYU PREVOST 1976/03/16 [id number]    [place] [facility] [ACO] Patient: [place] plan  Patient is currently assigned with [place] Management for post hospital transition of care follow up.  Patient is to be engaged by a Telephonic [facility] RN Care Coordinator for the [place].   Plan: Patient will be followed by [place] Coordinator for post hospital support and evaluation for care management follow up needs. Will alert inpatient [facility] RNCM of plan with [facility] post hospital via secure chat and follow with inpatient [facility] for post hospital needs.   For additional questions or referrals please contact:   Natividad Brood, RN BSN [facility] Liaison  [phone] business mobile phone Toll free office [phone]  Fax number: [phone] [email or web address]

## 2021-04-28 DIAGNOSIS — I2111 ST elevation (STEMI) myocardial infarction involving right coronary artery: Secondary | ICD-10-CM | POA: Diagnosis not present

## 2021-04-28 LAB — BASIC METABOLIC PANEL
Anion gap: 4 — ABNORMAL LOW (ref 5–15)
BUN: 8 mg/dL (ref 6–20)
CO2: 24 mmol/L (ref 22–32)
Calcium: 7.8 mg/dL — ABNORMAL LOW (ref 8.9–10.3)
Chloride: 108 mmol/L (ref 98–111)
Creatinine, Ser: 0.6 mg/dL (ref 0.44–1.00)
GFR, Estimated: >60 mL/min (ref >60)
Glucose, Bld: 116 mg/dL — ABNORMAL HIGH (ref 70–99)
Potassium: 3.9 mmol/L (ref 3.5–5.1)
Sodium: 136 mmol/L (ref 135–145)

## 2021-04-28 LAB — CBC
HCT: 25 % — ABNORMAL LOW (ref 36.0–46.0)
Hemoglobin: 8.5 g/dL — ABNORMAL LOW (ref 12.0–15.0)
MCH: 32.1 pg (ref 26.0–34.0)
MCHC: 34 g/dL (ref 30.0–36.0)
MCV: 94.3 fL (ref 80.0–100.0)
Platelets: 218 10*3/uL (ref 150–400)
RBC: 2.65 MIL/uL — ABNORMAL LOW (ref 3.87–5.11)
RDW: 12.6 % (ref 11.5–15.5)
WBC: 13.1 10*3/uL — ABNORMAL HIGH (ref 4.0–10.5)
nRBC: 0 % (ref 0.0–0.2)

## 2021-04-28 MED ORDER — ASPIRIN 81 MG PO TBEC
81.0000 mg | DELAYED_RELEASE_TABLET | Freq: Every day | ORAL | 11 refills | Status: AC
Start: 2021-04-29 — End: ?

## 2021-04-28 MED ORDER — FERROUS SULFATE 325 (65 FE) MG PO TABS
325.0000 mg | ORAL_TABLET | Freq: Every day | ORAL | Status: DC
  Administered 2021-04-29: 325 mg via ORAL
  Filled 2021-04-28: qty 1

## 2021-04-28 MED ORDER — POTASSIUM CHLORIDE CRYS ER 20 MEQ PO TBCR
20.0000 meq | EXTENDED_RELEASE_TABLET | Freq: Every day | ORAL | Status: DC
  Administered 2021-04-28 – 2021-04-29 (×2): 20 meq via ORAL
  Filled 2021-04-28 (×2): qty 1

## 2021-04-28 MED ORDER — FUROSEMIDE 40 MG PO TABS
40.0000 mg | ORAL_TABLET | Freq: Every day | ORAL | Status: DC
  Administered 2021-04-28 – 2021-04-29 (×2): 40 mg via ORAL
  Filled 2021-04-28 (×2): qty 1

## 2021-04-28 NOTE — Progress Notes (Signed)
CARDIAC REHAB PHASE I   PRE:  Rate/Rhythm: 84 SR    BP: sitting 124/56    SaO2: 99 RA  MODE:  Ambulation: 300  ft   POST:  Rate/Rhythm: 100 ST    BP: sitting 117/61     SaO2: 100 RA  Pt moving independently with appropriate mechanics. Steady walking, rest x2 for fatigue. Return to bed. Practiced IS. Doing well. Will educate later. [id number]   [place], ACSM 04/28/2021 9:33 AM

## 2021-04-28 NOTE — Progress Notes (Addendum)
   45 year old female with scad, spontaneous coronary artery dissection requiring emergent CABG  Some fatigue noted.  No significant chest pain.  Heart regular rate and rhythm/mildly tachycardic Lungs clear to auscultation bilaterally  Telemetry with sinus rhythm.  Assessment and plan:  Spontaneous coronary artery dissection/STEMI -CABG x1 SVG to PDA -Normal LV systolic function -Cardiothoracic surgery notes reviewed.  Sinus rhythm.  Doing well.  Mild Lasix dose.  Progressing.  Candee Furbish, MD

## 2021-04-28 NOTE — Progress Notes (Addendum)
      [address]       [place] [address]             [phone]        3 Days Post-Op Procedure(s) (LRB): CORONARY ARTERY BYPASS GRAFTING (CABG) times one using right greater saphenous vein harvested endoscopically (N/A) TRANSESOPHAGEAL ECHOCARDIOGRAM (TEE) (N/A) ENDOVEIN HARVEST OF GREATER SAPHENOUS VEIN (Right)  Subjective: Patient sitting in chair. She has already had a bowel movement. She has no specific complaint this am.  Objective: Vital signs in last 24 hours: Temp:  [97.9 F (36.6 C)-98.7 F (37.1 C)] 98.5 F (36.9 C) (05/28 0432) Pulse Rate:  [76-86] 86 (05/28 0432) Cardiac Rhythm: Normal sinus rhythm (05/27 2340) Resp:  [19-24] 20 (05/28 0432) BP: (101-121)/(42-88) 109/46 (05/28 0432) SpO2:  [96 %-100 %] 99 % (05/28 0432) FiO2 (%):  [21 %] 21 % (05/27 1425) Weight:  [79.5 kg] 79.5 kg (05/28 0432)  Pre op weight 79.2 kg Current Weight  04/28/21 79.5 kg      Intake/Output from previous day: 05/27 0701 - 05/28 0700 In: 340 [P.O.:340] Out: 545 [Urine:475; Chest Tube:70]   Physical Exam:  Cardiovascular: RRR Pulmonary: Slightly diminished bibasilar breath sounds Abdomen: Soft, non tender, bowel sounds present. Extremities: Mild bilateral lower extremity edema. Wound: Aquacel removed and sternal wound is clean and dry.  No erythema or signs of infection.  Lab Results: CBC: Recent Labs    04/27/21 0313 04/28/21 0053  WBC 13.6* 13.1*  HGB 8.2* 8.5*  HCT 24.3* 25.0*  PLT 176 218   BMET:  Recent Labs    04/27/21 0313 04/28/21 0053  NA 127* 136  K 3.9 3.9  CL 98 108  CO2 25 24  GLUCOSE 111* 116*  BUN 9 8  CREATININE 0.54 0.60  CALCIUM 7.0* 7.8*    PT/INR:  Lab Results  Component Value Date   INR 1.2 04/25/2021   INR 0.9 04/25/2021   ABG:  INR: Will add last result for INR, ABG once components are confirmed Will add last 4 CBG results once components are confirmed  Assessment/Plan:  1. CV - SR with HR in the  80's. On Lopressor 12.5 mg bid, Plavix 75 mg daily, and baby ec asa. Pacer turned off and I disconnected it. Will remove EPW later this am. 2.  Pulmonary - On room air. Check PA/:AT CXR in am. Encourage incentive spirometer.  3. Mild volume Overload - will give low dose Lasix this am 4.  Expected post op acute blood loss anemia - H and H this am stable at 8.5 and 25.Start oral ferrous 5. Supplement potassium 6. Dscharge in 1-2 days  Sharalyn Ink [facility] 04/28/2021,8:13 AM   Patient seen and examined, agree with above Dc pacing wires Start plavix Possibly home tomorrow  Revonda Standard. Roxan Hockey, MD [facility] [phone]

## 2021-04-28 NOTE — Progress Notes (Signed)
[id number] Education completed with pt who voiced understanding of ed.  Husband gone home. Discussed sternal precautions and staying in the tube, IS,heart healthy diet given, walking instructions for ex, wound care, and CRP 2. Referred to [place] program. Graylon Good RN BSN 04/28/2021 2:11 PM

## 2021-04-28 NOTE — Progress Notes (Signed)
Pt's epicardial pacing wires removed per MD order.  No complications to report at this time.  Pt tolerated well and is currently comfortable.  Pt placed on bedrest with vital checks per protocol.

## 2021-04-28 NOTE — Progress Notes (Signed)
Mobility Specialist - Progress Note   04/28/21 1611  Mobility  Activity Ambulated in hall  Level of Assistance Standby assist, set-up cues, supervision of patient - no hands on  Assistive Device None  Distance Ambulated (ft) 470 ft  Mobility Ambulated with assistance in hallway  Mobility Response Tolerated well  Mobility performed by Mobility specialist  $Mobility charge 1 Mobility   Pt asx throughout ambulation and she did not require a rest break. Pt back in bed after walk, call bell at side. VSS throughout.  Pricilla Handler Mobility Specialist Mobility Specialist Phone: [phone]

## 2021-04-29 ENCOUNTER — Inpatient Hospital Stay (HOSPITAL_COMMUNITY): Payer: 59

## 2021-04-29 LAB — TYPE AND SCREEN
ABO/RH(D): O POS
Antibody Screen: NEGATIVE
Unit division: 0
Unit division: 0
Unit division: 0
Unit division: 0

## 2021-04-29 LAB — BPAM RBC
Blood Product Expiration Date: 202206202359
Blood Product Expiration Date: 202206252359
Blood Product Expiration Date: 202206252359
Blood Product Expiration Date: 202206252359
ISSUE DATE / TIME: 202205241323
Unit Type and Rh: 5100
Unit Type and Rh: 5100
Unit Type and Rh: 5100
Unit Type and Rh: 5100

## 2021-04-29 MED ORDER — ROSUVASTATIN CALCIUM 20 MG PO TABS: 20.0000 mg | ORAL_TABLET | Freq: Every day | ORAL | Status: DC

## 2021-04-29 MED ORDER — ATORVASTATIN CALCIUM 40 MG PO TABS: 40.0000 mg | ORAL_TABLET | Freq: Every day | ORAL | 1 refills | Status: DC

## 2021-04-29 MED ORDER — OXYCODONE HCL 5 MG PO TABS: 5.0000 mg | ORAL_TABLET | ORAL | 0 refills | Status: DC | PRN

## 2021-04-29 MED ORDER — METOPROLOL TARTRATE 25 MG PO TABS: 12.5000 mg | ORAL_TABLET | Freq: Two times a day (BID) | ORAL | 1 refills | Status: DC

## 2021-04-29 MED ORDER — CLOPIDOGREL BISULFATE 75 MG PO TABS: 75.0000 mg | ORAL_TABLET | Freq: Every day | ORAL | 1 refills | Status: DC

## 2021-04-29 MED ORDER — ATORVASTATIN CALCIUM 40 MG PO TABS: 40.0000 mg | ORAL_TABLET | Freq: Every day | ORAL | Status: DC

## 2021-04-29 MED ORDER — FUROSEMIDE 40 MG PO TABS: 40.0000 mg | ORAL_TABLET | Freq: Every day | ORAL | 0 refills | Status: DC

## 2021-04-29 MED ORDER — POTASSIUM CHLORIDE CRYS ER 20 MEQ PO TBCR: 20.0000 meq | EXTENDED_RELEASE_TABLET | Freq: Every day | ORAL | 0 refills | Status: DC

## 2021-04-29 NOTE — Progress Notes (Addendum)
      [address]       [place] [address]             [phone]        4 Days Post-Op Procedure(s) (LRB): CORONARY ARTERY BYPASS GRAFTING (CABG) times one using right greater saphenous vein harvested endoscopically (N/A) TRANSESOPHAGEAL ECHOCARDIOGRAM (TEE) (N/A) ENDOVEIN HARVEST OF GREATER SAPHENOUS VEIN (Right)  Subjective: Patient sitting in chair.She has no specific complaint this am. She wants to go home.  Objective: Vital signs in last 24 hours: Temp:  [97.5 F (36.4 C)-99.4 F (37.4 C)] 98 F (36.7 C) (05/29 0537) Pulse Rate:  [64-84] 82 (05/29 0537) Cardiac Rhythm: Normal sinus rhythm (05/28 1935) Resp:  [17-20] 17 (05/29 0537) BP: (102-127)/(44-62) 115/54 (05/29 0537) SpO2:  [98 %-100 %] 100 % (05/29 0537) Weight:  [77.6 kg] 77.6 kg (05/29 0537)  Pre op weight 79.2 kg Current Weight  04/29/21 77.6 kg      Intake/Output from previous day: 05/28 0701 - 05/29 0700 In: 360 [P.O.:360] Out: -    Physical Exam:  Cardiovascular: RRR Pulmonary: Slightly diminished bibasilar breath sounds Abdomen: Soft, non tender, bowel sounds present. Extremities:Trace bilateral lower extremity edema. Mild LUE ecchymosis (from a line). Motor/sensory intact and hand warm Wound: Sternal wound is clean and dry.  No erythema or signs of infection.  Lab Results: CBC: Recent Labs    04/27/21 0313 04/28/21 0053  WBC 13.6* 13.1*  HGB 8.2* 8.5*  HCT 24.3* 25.0*  PLT 176 218   BMET:  Recent Labs    04/27/21 0313 04/28/21 0053  NA 127* 136  K 3.9 3.9  CL 98 108  CO2 25 24  GLUCOSE 111* 116*  BUN 9 8  CREATININE 0.54 0.60  CALCIUM 7.0* 7.8*    PT/INR:  Lab Results  Component Value Date   INR 1.2 04/25/2021   INR 0.9 04/25/2021   ABG:  INR: Will add last result for INR, ABG once components are confirmed Will add last 4 CBG results once components are confirmed  Assessment/Plan:  1. CV - SR with HR in the 80's. On Lopressor 12.5 mg bid,  Plavix 75 mg daily, and baby ec asa.  2.  Pulmonary - On room air.  PA/:AT CXR this am appears stable (no pneumothorax, small left pleural effusion/atelectasis). Encourage incentive spirometer.  3. Mild volume Overload - will give low dose Lasix this am 4.  Expected post op acute blood loss anemia - Last H and H  stable at 8.5 and 25.Continue oral ferrous 5. Based on lipid profile, started Atorvastatin 40 mg at hs. Cardiology to titrate as an outpatient. 6. Discharge;chest tube sutures will be removed in office after discharge  Sharalyn Ink [facility] 04/29/2021,7:29 AM   Patient seen and examined, agree with above Home today  [place]. Roxan Hockey, MD [facility] [phone]

## 2021-05-01 ENCOUNTER — Other Ambulatory Visit: Payer: Self-pay | Admitting: *Deleted

## 2021-05-01 NOTE — Patient Outreach (Signed)
[facility]) Care Management  05/01/2021  Anna Vang Jun 15, 1976 [id number]   Transition of care telephone call  Referral received:04/26/21 Initial outreach:05/01/21 Insurance: [facility]   Initial unsuccessful telephone call to patient's preferred number in order to complete transition of care assessment; no answer, unable to leave a message voicemail box full.   Objective: Per the electronic medical record, [name]   was hospitalized at [facility] 5/25-5/29, Chest Pain, STEMI, CABG x 1 . No significant PMH . She  was discharged to home on 04/29/21  without the need for home health services or durable medical equipment per the discharge summary.   Plan: This RNCM will route unsuccessful outreach letter with [place] Management pamphlet and 24 hour Nurse Advice Line Magnet to [place] Management clinical pool to be mailed to patient's home address. This RNCM will attempt another outreach within 3- 4 business days.  Joylene Draft, RN, BSN  [place] Management Coordinator  [phone]- Mobile [phone]- Toll Free Main Office

## 2021-05-04 ENCOUNTER — Other Ambulatory Visit: Payer: Self-pay | Admitting: *Deleted

## 2021-05-04 NOTE — Patient Outreach (Signed)
[place] [facility]) Care Management  05/04/2021  Anna Vang May 20, 1976 [id number]   Transition of care call Referral received: 04/26/21 Initial outreach attempt: 05/01/21 Insurance: Standard   #2 Outreach call attempt  2nd unsuccessful telephone call to patient's preferred contact number in order to complete post hospital discharge transition of care assessment , no answer , mailbox is full unable to leave a message for return call.    Objective: Per the electronic medical record, [name]   was hospitalized at [facility] 5/25-5/29, Chest Pain, STEMI, CABG x 1 . No significant PMH . She  was discharged to home on 04/29/21  without the need for home health services or durable medical equipment per the discharge summary.    Plan If no return call from patient will attempt 3rd outreach in the next 4 business days.   Joylene Draft, RN, BSN  [place] Management Coordinator  [phone]- Mobile [phone]- Toll Free Main Office

## 2021-05-08 ENCOUNTER — Other Ambulatory Visit: Payer: Self-pay

## 2021-05-08 ENCOUNTER — Telehealth (HOSPITAL_COMMUNITY): Payer: Self-pay

## 2021-05-08 ENCOUNTER — Encounter (INDEPENDENT_AMBULATORY_CARE_PROVIDER_SITE_OTHER): Payer: Self-pay

## 2021-05-08 DIAGNOSIS — Z4802 Encounter for removal of sutures: Secondary | ICD-10-CM

## 2021-05-08 NOTE — Telephone Encounter (Signed)
Pt insurance is active and benefits verified through [facility]. Co-pay $0.00, DED $300.00/$600.00 met, out of pocket $790.00/$600.29 met, co-insurance 20%. No pre-authorization required. Liz/UMR, 05/08/21 @ 11:37AM, ARW#[id number]  Will contact patient to see if she is interested in the Cardiac Rehab Program. If interested, patient will need to complete follow up appt. Once completed, patient will be contacted for scheduling upon review by the RN Navigator.

## 2021-05-08 NOTE — Telephone Encounter (Signed)
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  Closed referral

## 2021-05-10 ENCOUNTER — Other Ambulatory Visit: Payer: Self-pay | Admitting: *Deleted

## 2021-05-10 NOTE — Patient Outreach (Signed)
[place] [facility]) Care Management  05/10/2021  Anna Vang [birth year] [id number]   Transition of care call Referral received: 04/26/21 Initial outreach attempt:05/01/21 Insurance: [employer] unsuccessful telephone call to patient's preferred contact number in order to complete post hospital discharge transition of care assessment; no answer, left HIPAA compliant message requesting return call.   Objective: Per the electronic medical record, Anna Vang   was hospitalized at [facility] 5/25-5/29, Chest Pain, STEMI, CABG x 1 . No significant PMH . She  was discharged to home on 04/29/21  without the need for home health services or durable medical equipment per the discharge summary. Plan: If no return call from patient, will plan return call in the next 3 weeks.   Joylene Draft, RN, BSN  [place] Management Coordinator  [phone]- Mobile [phone]- Toll Free Main Office

## 2021-05-16 ENCOUNTER — Other Ambulatory Visit: Payer: Self-pay

## 2021-05-16 ENCOUNTER — Other Ambulatory Visit: Payer: Self-pay | Admitting: Cardiothoracic Surgery

## 2021-05-16 ENCOUNTER — Encounter: Payer: Self-pay | Admitting: Cardiovascular Disease

## 2021-05-16 ENCOUNTER — Ambulatory Visit: Payer: 59 | Admitting: Cardiovascular Disease

## 2021-05-16 VITALS — BP 110/70 | HR 84 | Ht 62.0 in | Wt 161.6 lb

## 2021-05-16 DIAGNOSIS — Z951 Presence of aortocoronary bypass graft: Secondary | ICD-10-CM

## 2021-05-16 DIAGNOSIS — I773 Arterial fibromuscular dysplasia: Secondary | ICD-10-CM

## 2021-05-16 DIAGNOSIS — I2542 Coronary artery dissection: Secondary | ICD-10-CM

## 2021-05-16 NOTE — Patient Instructions (Signed)
Medication Instructions:  1) STOP CLOPIDOGREL 05/26/21 *If you need a refill on your cardiac medications before your next appointment, please call your pharmacy*  Lab Work: Your provider recommends that you have FASTING lab work prior to your follow-up appointment in 3 months. If you have labs (blood work) drawn today and your tests are completely normal, you will receive your results only by: [place] (if you have MyChart) OR A paper copy in the mail If you have any lab test that is abnormal or we need to change your treatment, we will call you to review the results.  Testing/Procedures: Your physician has requested that you have a carotid duplex. This test is an ultrasound of the carotid arteries in your neck. It looks at blood flow through these arteries that supply the brain with blood. Allow one hour for this exam. There are no restrictions or special instructions.  Your physician has requested that you have an echocardiogram. Echocardiography is a painless test that uses sound waves to create images of your heart. It provides your doctor with information about the size and shape of your heart and how well your heart's chambers and valves are working. This procedure takes approximately one hour. There are no restrictions for this procedure.  Your physician has requested that you have a renal artery duplex. During this test, an ultrasound is used to evaluate blood flow to the kidneys. Allow one hour for this exam. Do not eat after midnight the day before and avoid carbonated beverages. Take your medications as you usually do.  Follow-Up: At [facility], you and your health needs are our priority.  As part of our continuing mission to provide you with exceptional heart care, we have created designated Provider Care Teams.  These Care Teams include your primary Cardiologist (physician) and Advanced Practice Providers (APPs -  Physician Assistants and Nurse Practitioners) who all work  together to provide you with the care you need, when you need it. Your next appointment:   3 month(s) The format for your next appointment:   In Person Provider:   Richardson Dopp

## 2021-05-16 NOTE — Progress Notes (Signed)
Cardiology Office Note:    Date:  05/17/2021   ID:  Anna Vang, DOB [birth year], MRN [id number]  PCP:  Elby Beck, FNP (Inactive)   [facility] Providers Cardiologist:  Sherren Mocha, MD     Referring MD: No ref. provider found   Chief Complaint  Patient presents with   Coronary Artery Disease     History of Present Illness:    Anna Vang is a 45 y.o. 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  The patient presented Apr 25, 2021 with inferior STEMI complicated by heart block and hypotension.  She had a paucity of cardiovascular risk factors and angiographically had a typical appearance of coronary artery dissection involving the RCA.  PCI was attempted but was unsuccessful due to extensive dissection.  The patient was taken emergently for coronary bypass surgery with a saphenous vein graft to RCA performed.  Her postoperative course was uncomplicated.  Intraoperative transesophageal echo showed normal LV function with no regional wall motion abnormalities.  Following bypass, there was akinesis of the basal inferior wall with an LVEF of 45 to 50%.  She presents today for hospital follow-up.  The patient is here alone today.  She is actually feeling pretty good.  She is walking her dog a few times per day and not having any exertional symptoms with that.  She has expected chest soreness that she states feels like her sternum is healing up.  No dyspnea, orthopnea, PND, or heart palpitations.  No lightheadedness or syncope.  She has some generalized fatigue but no other specific complaints at this time.  Past Medical History:  Diagnosis Date   Anxiety     Past Surgical History:  Procedure Laterality Date   CESAREAN SECTION     CORONARY ANGIOGRAPHY N/A 04/25/2021   Procedure: CORONARY ANGIOGRAPHY;  Surgeon: Sherren Mocha, MD;  Location: [place] [facility];  Service: Cardiovascular;  Laterality: N/A;   CORONARY  ARTERY BYPASS GRAFT N/A 04/25/2021   Procedure: CORONARY ARTERY BYPASS GRAFTING (CABG) times one using right greater saphenous vein harvested endoscopically;  Surgeon: Wonda Olds, MD;  Location: [facility] [place];  Service: Open Heart Surgery;  Laterality: N/A;   CORONARY/GRAFT ACUTE MI REVASCULARIZATION N/A 04/25/2021   Procedure: Coronary/Graft Acute MI Revascularization;  Surgeon: Sherren Mocha, MD;  Location: [place] [facility];  Service: Cardiovascular;  Laterality: N/A;   ENDOVEIN HARVEST OF GREATER SAPHENOUS VEIN Right 04/25/2021   Procedure: ENDOVEIN HARVEST OF GREATER SAPHENOUS VEIN;  Surgeon: Wonda Olds, MD;  Location: [place] [facility];  Service: Open Heart Surgery;  Laterality: Right;   LEFT HEART CATH AND CORONARY ANGIOGRAPHY N/A 04/25/2021   Procedure: LEFT HEART CATH AND CORONARY ANGIOGRAPHY;  Surgeon: Sherren Mocha, MD;  Location: [place] [facility];  Service: Cardiovascular;  Laterality: N/A;   TEE WITHOUT CARDIOVERSION N/A 04/25/2021   Procedure: TRANSESOPHAGEAL ECHOCARDIOGRAM (TEE);  Surgeon: Wonda Olds, MD;  Location: [place];  Service: Open Heart Surgery;  Laterality: N/A;    Current Medications: Current Meds  Medication Sig   Ascorbic Acid (VITAMIN C PO) Take 1 tablet by mouth daily.   aspirin EC 81 MG EC tablet Take 1 tablet (81 mg total) by mouth daily. Swallow whole.   atorvastatin (LIPITOR) 40 MG tablet Take 1 tablet (40 mg total) by mouth at bedtime.   cetirizine (ZYRTEC) 10 MG tablet Take 10 mg by mouth daily as needed for allergies.   clopidogrel (PLAVIX) 75 MG tablet  Take 1 tablet (75 mg total) by mouth daily.   folic acid (FOLVITE) 768 MCG tablet Take 400 mcg by mouth daily.   furosemide (LASIX) 40 MG tablet Take 1 tablet (40 mg total) by mouth daily. For 3 days then stop   magnesium 30 MG tablet Take 30 mg by mouth daily.   metoprolol tartrate (LOPRESSOR) 25 MG tablet Take 0.5 tablets (12.5 mg total) by mouth 2 (two) times daily.   Multiple Vitamins-Minerals  (MULTIVITAMIN WITH MINERALS) tablet Take 1 tablet by mouth daily.   oxyCODONE (OXY IR/ROXICODONE) 5 MG immediate release tablet Take 1 tablet (5 mg total) by mouth every 4 (four) hours as needed for severe pain.   potassium chloride SA (KLOR-CON) 20 MEQ tablet Take 1 tablet (20 mEq total) by mouth daily. For 3 days then stop.   Probiotic Product (PROBIOTIC PO) Take 1 capsule by mouth daily.   VITAMIN D PO Take 1 tablet by mouth daily.     Allergies:   Patient has no known allergies.   Social History   Socioeconomic History   Marital status: Married    Spouse name: Not on file   Number of children: Not on file   Years of education: Not on file   Highest education level: Not on file  Occupational History   Not on file  Tobacco Use   Smoking status: Never   Smokeless tobacco: Never  Substance and Sexual Activity   Alcohol use: Yes    Alcohol/week: 2.0 standard drinks    Types: 2 Glasses of wine per week   Drug use: No   Sexual activity: Yes    Birth control/protection: None  Other Topics Concern   Not on file  Social History Narrative   Not on file   Social Determinants of Health   Financial Resource Strain: Not on file  Food Insecurity: Not on file  Transportation Needs: Not on file  Physical Activity: Not on file  Stress: Not on file  Social Connections: Not on file     Family History: The patient's family history includes Diabetes in her father; Hypertension in her brother and father; Kidney disease in her mother; Mental illness in her mother.  ROS:   Please see the history of present illness.    All other systems reviewed and are negative.  EKGs/Labs/Other Studies Reviewed:    The following studies were reviewed today: Intra-op TEE: Left Ventricle: The left ventricle has normal systolic function, with an  ejection fraction of 55-60%. The cavity size was normal. There is no  increase in left ventricular wall thickness. No evidence of left  ventricular  regional wall motion  abnormalities. There is no left ventricular hypertrophy.   On the post-bypass exam, there was akinesis of the basal inferior wall.  The remaining LV segments appeared to contract normally. The ejection  fraction was 45-50% by visual estimation.   Cath: Conclusion  1.  Widely patent left main, LAD, and left circumflex 2.  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  PCI is attempted but is unsuccessful because of extension of the coronary dissection with balloon angioplasty and inability to restore flow into the vessel.  EKG:  EKG is not ordered today.    Recent Labs: 04/25/2021: ALT 24 04/26/2021: Magnesium 2.0 04/28/2021: BUN 8; Creatinine, Ser 0.60; Hemoglobin 8.5; Platelets 218; Potassium 3.9; Sodium 136  Recent Lipid Panel    Component  Value Date/Time   CHOL 265 (H) 04/25/2021 1426   TRIG 407 (H) 04/25/2021 1426   HDL 39 (L) 04/25/2021 1426   CHOLHDL 6.8 04/25/2021 1426   VLDL UNABLE TO CALCULATE IF TRIGLYCERIDE OVER 400 mg/dL 04/25/2021 1426   LDLCALC UNABLE TO CALCULATE IF TRIGLYCERIDE OVER 400 mg/dL 04/25/2021 1426   LDLDIRECT 173.7 (H) 04/25/2021 1426     Risk Assessment/Calculations:       Physical Exam:    VS:  BP 110/70   Pulse 84   Ht 5\' 2"  (1.575 m)   Wt 161 lb 9.6 oz (73.3 kg)   SpO2 99%   BMI 29.56 kg/m     Wt Readings from Last 3 Encounters:  05/16/21 161 lb 9.6 oz (73.3 kg)  04/29/21 171 lb (77.6 kg)  03/06/17 145 lb 4 oz (65.9 kg)     GEN:  Well nourished, well developed in no acute distress HEENT: Normal NECK: No JVD; No carotid bruits LYMPHATICS: No lymphadenopathy CARDIAC: RRR, no murmurs, rubs, gallops RESPIRATORY:  Clear to auscultation without rales, wheezing or rhonchi  ABDOMEN: Soft, non-tender, non-distended MUSCULOSKELETAL:  No edema; No deformity  SKIN: Warm and dry NEUROLOGIC:  Alert and oriented x  3 PSYCHIATRIC:  Normal affect   ASSESSMENT:    1. Spontaneous dissection of coronary artery   2. Fibromuscular dysplasia (HCC)    PLAN:    In order of problems listed above:  Patient doing remarkably well just a few weeks out from emergency CABG for treatment of spontaneous coronary artery dissection/acute inferior STEMI complicated by heart block and hypotension.  I discussed with the patient what we understand about spontaneous coronary artery dissection, including natural history, associated problems, and medical therapy.  I have recommended that she continue clopidogrel through June 25 which will get her out to 1 month from CABG, then stop clopidogrel and stay on aspirin 81 mg daily.  There is some data that dual antiplatelet therapy might be associated with increased adverse events following spontaneous coronary artery dissection.  I have recommended a 2D echocardiogram to assess overall LV function and specifically look at the inferior wall motion.  I have recommended a carotid ultrasound and renal ultrasound to look for associated fibromuscular dysplasia.  It is recommended that the patient resume metoprolol 12.5 mg twice daily and continue on atorvastatin 40 mg daily.  We will arrange follow-up lipids and LFTs.  I would like her to return in 3 months for an APP visit.  She follows up with Dr. Orvan Seen tomorrow and I am sure he will address specific questions about when she can resume driving and return to work.        Medication Adjustments/Labs and Tests Ordered: Current medicines are reviewed at length with the patient today.  Concerns regarding medicines are outlined above.  Orders Placed This Encounter  Procedures   Lipid panel   CBC with Differential/Platelet   Comprehensive metabolic panel   ECHOCARDIOGRAM COMPLETE   VAS US CAROTID   VAS US RENAL ARTERY DUPLEX    No orders of the defined types were placed in this encounter.   Patient Instructions  Medication  Instructions:  1) STOP CLOPIDOGREL 05/26/21 *If you need a refill on your cardiac medications before your next appointment, please call your pharmacy*  Lab Work: Your provider recommends that you have FASTING lab work prior to your follow-up appointment in 3 months. If you have labs (blood work) drawn today and your tests are completely normal, you will receive your results  only by: [employer] (if you have MyChart) OR A paper copy in the mail If you have any lab test that is abnormal or we need to change your treatment, we will call you to review the results.  Testing/Procedures: Your physician has requested that you have a carotid duplex. This test is an ultrasound of the carotid arteries in your neck. It looks at blood flow through these arteries that supply the brain with blood. Allow one hour for this exam. There are no restrictions or special instructions.  Your physician has requested that you have an echocardiogram. Echocardiography is a painless test that uses sound waves to create images of your heart. It provides your doctor with information about the size and shape of your heart and how well your heart's chambers and valves are working. This procedure takes approximately one hour. There are no restrictions for this procedure.  Your physician has requested that you have a renal artery duplex. During this test, an ultrasound is used to evaluate blood flow to the kidneys. Allow one hour for this exam. Do not eat after midnight the day before and avoid carbonated beverages. Take your medications as you usually do.  Follow-Up: At [facility], you and your health needs are our priority.  As part of our continuing mission to provide you with exceptional heart care, we have created designated Provider Care Teams.  These Care Teams include your primary Cardiologist (physician) and Advanced Practice Providers (APPs -  Physician Assistants and Nurse Practitioners) who all work together to  provide you with the care you need, when you need it. Your next appointment:   3 month(s) The format for your next appointment:   In Person Provider:   Richardson Dopp    Signed, Sherren Mocha, MD  05/17/2021 5:55 PM    [place]

## 2021-05-17 ENCOUNTER — Ambulatory Visit: Payer: Self-pay | Admitting: Cardiothoracic Surgery

## 2021-05-21 ENCOUNTER — Ambulatory Visit: Payer: Self-pay | Admitting: Cardiothoracic Surgery

## 2021-05-28 ENCOUNTER — Other Ambulatory Visit: Payer: Self-pay

## 2021-05-28 ENCOUNTER — Ambulatory Visit
Admission: RE | Admit: 2021-05-28 | Discharge: 2021-05-28 | Disposition: A | Discharge status: Home / Self Care | Payer: 59 | Source: Ambulatory Visit | Attending: Cardiothoracic Surgery | Admitting: Cardiothoracic Surgery

## 2021-05-28 ENCOUNTER — Ambulatory Visit (INDEPENDENT_AMBULATORY_CARE_PROVIDER_SITE_OTHER): Payer: Self-pay | Admitting: Physician Assistant

## 2021-05-28 VITALS — BP 130/72 | HR 95 | Resp 20 | Ht 62.0 in | Wt 161.0 lb

## 2021-05-28 DIAGNOSIS — Z951 Presence of aortocoronary bypass graft: Secondary | ICD-10-CM | POA: Diagnosis not present

## 2021-05-28 NOTE — Patient Instructions (Addendum)
Patient and I discussed the following:  She is only taking Tylenol PRN pain so she has already begun driving She was contacted by cardiac rehab but is walking and plans to resume exercising on her own (as she already started to prior to surgery). She is to continue with sternal precautions (no lifting more than 10 pounds) for a minimum of 2-3 weeks more. We will discuss her returning to work at her next office visit with Dr. Orvan Seen

## 2021-05-28 NOTE — Progress Notes (Signed)
HPI: This is a 45 year old female who presented with acute onset of chest pain and was found to have a STEMI. She underwent an urgent cardiac catheterization and was found to have SCAD (spontaneous coronary artery dissection) of the RCA. The patient did well post op and was discharged in stable condition on 04/29/2021. She denies shortness of breath, chest pain, and fever.  Current Outpatient Medications  Medication Sig Dispense Refill   Ascorbic Acid (VITAMIN C PO) Take 1 tablet by mouth daily.     aspirin EC 81 MG EC tablet Take 1 tablet (81 mg total) by mouth daily. Swallow whole. 30 tablet 11   atorvastatin (LIPITOR) 40 MG tablet Take 1 tablet (40 mg total) by mouth at bedtime. 30 tablet 1   cetirizine (ZYRTEC) 10 MG tablet Take 10 mg by mouth daily as needed for allergies.     clopidogrel (PLAVIX) 75 MG tablet Take 1 tablet (75 mg total) by mouth daily. 30 tablet 1   folic acid (FOLVITE) 353 MCG tablet Take 400 mcg by mouth daily.     furosemide (LASIX) 40 MG tablet Take 1 tablet (40 mg total) by mouth daily. For 3 days then stop 3 tablet 0   magnesium 30 MG tablet Take 30 mg by mouth daily.     metoprolol tartrate (LOPRESSOR) 25 MG tablet Take 0.5 tablets (12.5 mg total) by mouth 2 (two) times daily. 30 tablet 1   Multiple Vitamins-Minerals (MULTIVITAMIN WITH MINERALS) tablet Take 1 tablet by mouth daily.     oxyCODONE (OXY IR/ROXICODONE) 5 MG immediate release tablet Take 1 tablet (5 mg total) by mouth every 4 (four) hours as needed for severe pain. 30 tablet 0   potassium chloride SA (KLOR-CON) 20 MEQ tablet Take 1 tablet (20 mEq total) by mouth daily. For 3 days then stop. 3 tablet 0   Probiotic Product (PROBIOTIC PO) Take 1 capsule by mouth daily.     VITAMIN D PO Take 1 tablet by mouth daily.     Vitals:   05/28/21 1317  BP: 130/72  Pulse: 95  Resp: 20  SpO2: 98%     Physical Exam: CV-RRR Pulmonary-Clear to auscultation bilaterally Abdomen-Soft, non tender, bowel sounds  present Extremities-No LE edema Wounds-Clean and dry. Eschar on left chest tube wound but no sign of infection there or on sternal wound  Diagnostic Tests: Narrative & Impression  CLINICAL DATA:  Recent coronary artery bypass grafting   EXAM: CHEST - 2 VIEW   COMPARISON:  Apr 29, 2021   FINDINGS: Lungs are clear. Heart size and pulmonary vascularity are normal. Status post median sternotomy. No adenopathy. No bone lesions. No pneumothorax.   IMPRESSION: No edema or airspace opacity.  Heart size normal.  No pneumothorax.     Electronically Signed   By: Lowella Grip III M.D.   On: 05/28/2021 13:34    Impression and Plan: Overall, Mrs. Dlugosz is recovering well from emergent coronary artery bypass grafting surgery after SCAD. She has already seen Dr. Burt Knack in follow up on 05/16/2021. She finished Plavix as instructed on 05/26/2021 and continues to take baby ec asa daily. She has an echo and carotid duplex US ordered for July (per Dr. Antionette Char recommendations) as well as lipid profile, LFTs etc in September as well. She is only taking Tylenol for pain. She is already driving. She was contacted by cardiac rehab. She is walking everyday and plans to restart exercising as able. We did discuss she is to continue with  sternal precautions (no lifting more than 10 pounds) for a minimum of 2-3 weeks more. Of note, she did inquire about when she may return to work. She is a hair stylist. I recommended she not return to work until after her next visit (in about 4 weeks) with Dr. Orvan Seen.  Nani Skillern, PA-C [facility] [phone]

## 2021-05-30 ENCOUNTER — Other Ambulatory Visit: Payer: Self-pay | Admitting: *Deleted

## 2021-05-30 NOTE — Patient Outreach (Signed)
[facility]) Care Management  05/30/2021  Anna Vang 1976-08-22 [id number]   Transition of care /Case Closure Unsuccessful outreach    Referral received:04/26/21 Initial outreach:05/01/21 Insurance: [employer]   4th call attempt  Unable to complete post hospital discharge transition of care assessment. No return call form patient after 3 call attempts and no response to request to contact RN Care Coordinator in unsuccessful outreach letter mailed to home on 5/31//22.  Objective: .Per the electronic medical record, [name]   was hospitalized at [facility] 5/25-5/29, Chest Pain, STEMI, CABG x 1 . No significant PMH . She  was discharged to home on 04/29/21  without the need for home health services or durable medical equipment per the discharge summary.   Plan Case closed to [facility] [employer] [facility] [employer], unsuccessful call attempt x4.   Joylene Draft, RN, BSN  [facility] Management Coordinator  [phone]- Mobile [phone]- Toll Free Main Office

## 2021-05-31 ENCOUNTER — Other Ambulatory Visit (HOSPITAL_BASED_OUTPATIENT_CLINIC_OR_DEPARTMENT_OTHER): Payer: Self-pay

## 2021-05-31 MED ORDER — CLOPIDOGREL BISULFATE 75 MG PO TABS
ORAL_TABLET | ORAL | 1 refills | Status: DC
  Filled 2021-05-31: qty 30, 30d supply, fill #0

## 2021-05-31 MED ORDER — METOPROLOL TARTRATE 25 MG PO TABS
ORAL_TABLET | ORAL | 1 refills | Status: DC
  Filled 2021-05-31: qty 25, 25d supply, fill #0

## 2021-05-31 MED ORDER — ATORVASTATIN CALCIUM 40 MG PO TABS
ORAL_TABLET | ORAL | 1 refills | Status: DC
  Filled 2021-05-31: qty 30, 30d supply, fill #0

## 2021-06-20 ENCOUNTER — Ambulatory Visit (HOSPITAL_BASED_OUTPATIENT_CLINIC_OR_DEPARTMENT_OTHER): Payer: 59

## 2021-06-20 ENCOUNTER — Ambulatory Visit (HOSPITAL_BASED_OUTPATIENT_CLINIC_OR_DEPARTMENT_OTHER)
Admission: RE | Admit: 2021-06-20 | Discharge: 2021-06-20 | Disposition: A | Discharge status: Home / Self Care | Payer: 59 | Source: Ambulatory Visit | Attending: Cardiovascular Disease | Admitting: Cardiovascular Disease

## 2021-06-20 ENCOUNTER — Ambulatory Visit (HOSPITAL_COMMUNITY)
Admission: RE | Admit: 2021-06-20 | Discharge: 2021-06-20 | Disposition: A | Discharge status: Home / Self Care | Payer: 59 | Source: Ambulatory Visit | Attending: Cardiology | Admitting: Cardiology

## 2021-06-20 ENCOUNTER — Other Ambulatory Visit: Payer: Self-pay

## 2021-06-20 DIAGNOSIS — I773 Arterial fibromuscular dysplasia: Secondary | ICD-10-CM

## 2021-06-20 DIAGNOSIS — I2542 Coronary artery dissection: Secondary | ICD-10-CM | POA: Insufficient documentation

## 2021-06-20 LAB — ECHOCARDIOGRAM COMPLETE
Area-P 1/2: 2.95 cm2
S' Lateral: 2.7 cm

## 2021-06-26 ENCOUNTER — Other Ambulatory Visit: Payer: Self-pay | Admitting: Physician Assistant

## 2021-06-27 ENCOUNTER — Other Ambulatory Visit (HOSPITAL_BASED_OUTPATIENT_CLINIC_OR_DEPARTMENT_OTHER): Payer: Self-pay

## 2021-06-27 ENCOUNTER — Other Ambulatory Visit: Payer: Self-pay | Admitting: Cardiology

## 2021-06-27 MED FILL — Metoprolol Tartrate Tab 25 MG: ORAL | 60 days supply | Qty: 60 | Fill #0 | Status: AC

## 2021-06-28 ENCOUNTER — Ambulatory Visit (INDEPENDENT_AMBULATORY_CARE_PROVIDER_SITE_OTHER): Payer: Self-pay | Admitting: Cardiothoracic Surgery

## 2021-06-28 ENCOUNTER — Other Ambulatory Visit: Payer: Self-pay

## 2021-06-28 VITALS — BP 105/60 | HR 60 | Resp 20 | Ht 62.0 in | Wt 160.0 lb

## 2021-06-28 DIAGNOSIS — Z951 Presence of aortocoronary bypass graft: Secondary | ICD-10-CM

## 2021-06-28 NOTE — Progress Notes (Signed)
      [address]       ,[place] [address]             [place] OFFICE NOTE  Referring Provider is Elby Beck, FNP Primary Cardiologist is Sherren Mocha, MD PCP is Pcp, No   HPI:  46 yo lady 2 mos s/p urgent CABG for SCAD and STEMI. Did well after surgery. Has been getting back to normal activity including some light work. No complaints.   Current Outpatient Medications  Medication Sig Dispense Refill   Ascorbic Acid (VITAMIN C PO) Take 1 tablet by mouth daily.     aspirin EC 81 MG EC tablet Take 1 tablet (81 mg total) by mouth daily. Swallow whole. 30 tablet 11   atorvastatin (LIPITOR) 40 MG tablet Take 1 tablet by mouth everyday at bedtime 30 tablet 1   cetirizine (ZYRTEC) 10 MG tablet Take 10 mg by mouth daily as needed for allergies.     clopidogrel (PLAVIX) 75 MG tablet Take 1 tablet by mouth every day 30 tablet 1   folic acid (FOLVITE) A999333 MCG tablet Take 400 mcg by mouth daily.     magnesium 30 MG tablet Take 30 mg by mouth daily.     metoprolol tartrate (LOPRESSOR) 25 MG tablet Take 1/2 tablet by mouth 2 times daily 30 tablet 1   Multiple Vitamins-Minerals (MULTIVITAMIN WITH MINERALS) tablet Take 1 tablet by mouth daily.     Probiotic Product (PROBIOTIC PO) Take 1 capsule by mouth daily.     VITAMIN D PO Take 1 tablet by mouth daily.     No current facility-administered medications for this visit.      Physical Exam:   BP 105/60   Pulse 60   Resp 20   Ht '5\' 2"'$  (1.575 m)   Wt 72.6 kg   SpO2 97% Comment: RA  BMI 29.26 kg/m   General:  Well-appearing, NAD  Chest:   cta  CV:   rrr  Incisions:  Healing well  Abdomen:  sntnd  Extremities:  No edema  Diagnostic Tests:  Echo from 7/20 personally reviewed; I agree with interpretation and results   Impression:  Doing well after CABG for SCAD  Plan:  F/u with thoracic surgery prn F/u with [facility] cardiol annually  I spent in excess of 10 minutes  during the conduct of this office consultation and >50% of this time involved direct face-to-face encounter with the patient for counseling and/or coordination of their care.  Level 2                 10 minutes Level 3                 15 minutes Level 4                 25 minutes Level 5                 40 minutes  B. Murvin Natal, MD 06/28/2021 9:59 AM

## 2021-07-17 ENCOUNTER — Other Ambulatory Visit: Payer: Self-pay | Admitting: Physician Assistant

## 2021-07-18 ENCOUNTER — Other Ambulatory Visit (HOSPITAL_BASED_OUTPATIENT_CLINIC_OR_DEPARTMENT_OTHER): Payer: Self-pay

## 2021-07-20 ENCOUNTER — Other Ambulatory Visit (HOSPITAL_BASED_OUTPATIENT_CLINIC_OR_DEPARTMENT_OTHER): Payer: Self-pay

## 2021-07-23 ENCOUNTER — Other Ambulatory Visit (HOSPITAL_BASED_OUTPATIENT_CLINIC_OR_DEPARTMENT_OTHER): Payer: Self-pay

## 2021-07-24 ENCOUNTER — Other Ambulatory Visit: Payer: Self-pay | Admitting: Cardiology

## 2021-07-24 ENCOUNTER — Other Ambulatory Visit (HOSPITAL_BASED_OUTPATIENT_CLINIC_OR_DEPARTMENT_OTHER): Payer: Self-pay

## 2021-07-24 MED ORDER — ATORVASTATIN CALCIUM 40 MG PO TABS
ORAL_TABLET | ORAL | 1 refills | Status: DC
  Filled 2021-07-24: qty 30, 30d supply, fill #0
  Filled 2021-08-27: qty 30, 30d supply, fill #1

## 2021-08-20 ENCOUNTER — Other Ambulatory Visit: Payer: 59 | Admitting: *Deleted

## 2021-08-20 ENCOUNTER — Other Ambulatory Visit: Payer: Self-pay

## 2021-08-20 DIAGNOSIS — I773 Arterial fibromuscular dysplasia: Secondary | ICD-10-CM

## 2021-08-20 DIAGNOSIS — I2542 Coronary artery dissection: Secondary | ICD-10-CM | POA: Diagnosis not present

## 2021-08-20 LAB — LIPID PANEL
Chol/HDL Ratio: 4.4 ratio (ref 0.0–4.4)
Cholesterol, Total: 155 mg/dL (ref 100–199)
HDL: 35 mg/dL — ABNORMAL LOW (ref >39)
LDL Chol Calc (NIH): 91 mg/dL (ref 0–99)
Triglycerides: 165 mg/dL — ABNORMAL HIGH (ref 0–149)
VLDL Cholesterol Cal: 29 mg/dL (ref 5–40)

## 2021-08-20 LAB — COMPREHENSIVE METABOLIC PANEL
ALT: 19 IU/L (ref 0–32)
AST: 18 IU/L (ref 0–40)
Albumin/Globulin Ratio: 1.5 (ref 1.2–2.2)
Albumin: 3.7 g/dL — ABNORMAL LOW (ref 3.8–4.8)
Alkaline Phosphatase: 80 IU/L (ref 44–121)
BUN/Creatinine Ratio: 17 (ref 9–23)
BUN: 13 mg/dL (ref 6–24)
Bilirubin Total: 0.5 mg/dL (ref 0.0–1.2)
CO2: 21 mmol/L (ref 20–29)
Calcium: 8.9 mg/dL (ref 8.7–10.2)
Chloride: 105 mmol/L (ref 96–106)
Creatinine, Ser: 0.77 mg/dL (ref 0.57–1.00)
Globulin, Total: 2.5 g/dL (ref 1.5–4.5)
Glucose: 88 mg/dL (ref 65–99)
Potassium: 4.4 mmol/L (ref 3.5–5.2)
Sodium: 137 mmol/L (ref 134–144)
Total Protein: 6.2 g/dL (ref 6.0–8.5)
eGFR: 98 mL/min/{1.73_m2} (ref >59)

## 2021-08-20 LAB — CBC WITH DIFFERENTIAL/PLATELET
Basophils Absolute: 0 10*3/uL (ref 0.0–0.2)
Basos: 0 %
EOS (ABSOLUTE): 0.1 10*3/uL (ref 0.0–0.4)
Eos: 1 %
Hematocrit: 41.7 % (ref 34.0–46.6)
Hemoglobin: 13.9 g/dL (ref 11.1–15.9)
Immature Grans (Abs): 0 10*3/uL (ref 0.0–0.1)
Immature Granulocytes: 0 %
Lymphocytes Absolute: 2.3 10*3/uL (ref 0.7–3.1)
Lymphs: 22 %
MCH: 30 pg (ref 26.6–33.0)
MCHC: 33.3 g/dL (ref 31.5–35.7)
MCV: 90 fL (ref 79–97)
Monocytes Absolute: 0.5 10*3/uL (ref 0.1–0.9)
Monocytes: 5 %
Neutrophils Absolute: 7.4 10*3/uL — ABNORMAL HIGH (ref 1.4–7.0)
Neutrophils: 72 %
Platelets: 352 10*3/uL (ref 150–450)
RBC: 4.63 x10E6/uL (ref 3.77–5.28)
RDW: 13.4 % (ref 11.7–15.4)
WBC: 10.3 10*3/uL (ref 3.4–10.8)

## 2021-08-27 ENCOUNTER — Other Ambulatory Visit: Payer: Self-pay | Admitting: Cardiology

## 2021-08-27 ENCOUNTER — Other Ambulatory Visit (HOSPITAL_BASED_OUTPATIENT_CLINIC_OR_DEPARTMENT_OTHER): Payer: Self-pay

## 2021-08-27 MED ORDER — METOPROLOL TARTRATE 25 MG PO TABS
ORAL_TABLET | ORAL | 6 refills | Status: DC
  Filled 2021-08-27: qty 30, 30d supply, fill #0
  Filled 2021-09-27: qty 30, 30d supply, fill #1
  Filled 2021-10-29: qty 30, 30d supply, fill #2
  Filled 2021-11-28: qty 30, 30d supply, fill #3
  Filled 2021-12-31: qty 30, 30d supply, fill #4
  Filled 2022-01-31: qty 30, 30d supply, fill #5
  Filled 2022-03-01: qty 30, 30d supply, fill #6

## 2021-08-28 NOTE — Progress Notes (Addendum)
Cardiology Office Note:    Date:  08/29/2021   ID:  Anna Vang, DOB 14-Oct-1976, MRN [id number]  PCP:  Kathyrn Lass   [facility] Providers Cardiologist:  Sherren Mocha, MD     Referring MD: No ref. provider found   Chief Complaint:  F/u for CAD    Patient Profile:   Anna Vang is a 45 y.o. 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 BP PCI unsuccessful >> s/p CABG x 1 (S-RCA) Ischemic CM EF 45-50, inf AK Echocardiogram 7/22: EF 55-60, no RWMA  ?Fibromuscular dysplasia Carotid US 7/22: minimal beading, cannot exclude FMD Renal artery [place] 7/22: cannot r/o FMD  Prior CV studies: ECHOCARDIOGRAM 06/20/21 EF 55-60, no RWMA, GLS -21.4, normal RVSF, trivial MR  CAROTID US 06/20/21 Right Carotid: There is no evidence of stenosis in the right ICA. Minimal beading by color Doppler in the distal ICA, can not exclude fibromuscular dysplasia. Left Carotid: There is no evidence of stenosis in the left ICA. Minimal beading by color Doppler in the distal ICA, can not exclude fibromuscular dysplasia.  RENAL ARTERY [place] 06/20/21 Renal: Right: Normal size right kidney. Normal right Resistive Index. Normal cortical thickness of right kidney. 1-59% stenosis of the right renal artery. RRV flow present. Elevated peak systolic velocities and end diastolic velocities noted in the right mid renal artery with color Doppler turbulence, cannot exclude fibromuscular dysplasia. Left: Normal size of left kidney. Normal left Resistive Index. Normal cortical thickness of the left kidney. No evidence of left renal artery stenosis. LRV flow present. Mesenteric: Normal Celiac artery and Superior Mesenteric artery findings.   LEFT HEART CATH 04/25/2021 Narrative 1.  Widely patent left main, LAD, and left circumflex 2.  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  PCI is attempted but is unsuccessful because of extension of the coronary dissection with balloon angioplasty and inability to restore flow into the vessel.     History of Present Illness: Anna Vang was last seen by Dr. Burt Knack in 6/22.  Plavix was DC'd 1 month out from her MI/CABG due to data that suggests increased adverse events following SCAD (Spontaneous Coronary Artery Dissection).   She had a follow-up echocardiogram in July that demonstrated improved LV function with EF 55-60.  Carotid Dopplers and renal arterial Dopplers were obtained to assess for fibromuscular dysplasia.  The carotid ultrasound did demonstrate some beading in both carotid arteries.  Fibromuscular dysplasia could not be ruled out.  There were somewhat elevated velocities in the right renal artery and fibromuscular dysplasia cannot be excluded.  She returns for follow-up today.  Overall, she feels like she is doing very well.  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  She has some incisional soreness and some indigestion but no anginal symptoms.  She has not had shortness of breath, syncope, leg edema.       Past Medical History:  Diagnosis Date   Anxiety    Current Medications: Current Meds  Medication Sig   Ascorbic Acid (VITAMIN C PO) Take 1 tablet by mouth daily.   aspirin EC 81 MG EC tablet Take 1 tablet (81 mg total) by mouth daily. Swallow whole.   atorvastatin (LIPITOR) 40 MG tablet Take 1 tablet by mouth every day at bedtime   cetirizine (ZYRTEC) 10  MG tablet Take 10 mg by mouth daily as needed for allergies.   folic acid (FOLVITE) 841 MCG tablet Take 400 mcg by mouth daily.   magnesium 30 MG tablet Take 30 mg by mouth daily.   metoprolol tartrate (LOPRESSOR) 25 MG tablet Take 1/2 tablet by mouth 2 times daily   Multiple Vitamins-Minerals (MULTIVITAMIN WITH MINERALS) tablet Take 1 tablet by mouth daily.   Probiotic Product  (PROBIOTIC PO) Take 1 capsule by mouth daily.   VITAMIN D PO Take 1 tablet by mouth daily.    Allergies:   Patient has no known allergies.   Social History   Tobacco Use   Smoking status: Never   Smokeless tobacco: Never  Substance Use Topics   Alcohol use: Yes    Alcohol/week: 2.0 standard drinks    Types: 2 Glasses of wine per week   Drug use: No    Family Hx: The patient's family history includes Diabetes in her father; Hypertension in her brother and father; Kidney disease in her mother; Mental illness in her mother.  ROS see HPI  EKGs/Labs/Other Test Reviewed:    EKG:  EKG is not ordered today.  The ekg ordered today demonstrates n/a  Recent Labs: 04/26/2021: Magnesium 2.0 08/20/2021: ALT 19; BUN 13; Creatinine, Ser 0.77; Hemoglobin 13.9; Platelets 352; Potassium 4.4; Sodium 137   Recent Lipid Panel Lab Results  Component Value Date/Time   CHOL 155 08/20/2021 08:40 AM   TRIG 165 (H) 08/20/2021 08:40 AM   HDL 35 (L) 08/20/2021 08:40 AM   LDLCALC 91 08/20/2021 08:40 AM   LDLDIRECT 173.7 (H) 04/25/2021 02:26 PM     Risk Assessment/Calculations:          Physical Exam:    VS:  BP 120/68   Pulse 60   Ht 5\' 2"  (1.575 m)   Wt 167 lb (75.8 kg)   SpO2 98%   BMI 30.54 kg/m     Wt Readings from Last 3 Encounters:  08/29/21 167 lb (75.8 kg)  06/28/21 160 lb (72.6 kg)  05/28/21 161 lb (73 kg)    Constitutional:      Appearance: Healthy appearance. Not in distress.  Neck:     Thyroid: No thyromegaly.     Vascular: JVD normal.  Pulmonary:     Effort: Pulmonary effort is normal.     Breath sounds: No wheezing. No rales.  Cardiovascular:     Normal rate. Regular rhythm. Normal S1. Normal S2.      Murmurs: There is no murmur.  Edema:    Peripheral edema absent.  Abdominal:     Palpations: Abdomen is soft.  Skin:    General: Skin is warm and dry.  Neurological:     Mental Status: Alert and oriented to person, place and time.     Cranial Nerves: Cranial  nerves are intact.       ASSESSMENT & PLAN:   1. Spontaneous dissection of coronary artery Status post inferior STEMI due to spontaneous coronary dissection in May 2022 managed with CABG x1.  Her EF has recovered by echocardiogram in July 2022.  She is overall doing well without significant symptoms.  She remains on aspirin 81 mg, atorvastatin and metoprolol tartrate.  She had recent carotid Dopplers and renal arterial Dopplers which did not demonstrate significant findings.  However, fibromuscular dysplasia cannot be ruled out.  Continue current therapy and follow-up in May 2023.  2. Mixed hyperlipidemia Lipids overall improved on most recent lipid panel.  She remains  on atorvastatin 40 mg daily.        Dispo:  Return in about 8 months (around 04/28/2022) for Routine Follow Up, w/ Dr. Burt Knack, or Richardson Dopp, PA-C.   Medication Adjustments/Labs and Tests Ordered: Current medicines are reviewed at length with the patient today.  Concerns regarding medicines are outlined above.  Tests Ordered: No orders of the defined types were placed in this encounter.  Medication Changes: No orders of the defined types were placed in this encounter.  Signed, Richardson Dopp, PA-C  08/29/2021 5:03 PM    [facility] [address], [place], [place]  [address] Phone: [phone]; Fax: [phone]

## 2021-08-29 ENCOUNTER — Other Ambulatory Visit: Payer: Self-pay

## 2021-08-29 ENCOUNTER — Ambulatory Visit: Payer: 59 | Admitting: Physician Assistant

## 2021-08-29 ENCOUNTER — Encounter: Payer: Self-pay | Admitting: Physician Assistant

## 2021-08-29 VITALS — BP 120/68 | HR 60 | Ht 62.0 in | Wt 167.0 lb

## 2021-08-29 DIAGNOSIS — I2542 Coronary artery dissection: Secondary | ICD-10-CM

## 2021-08-29 DIAGNOSIS — E782 Mixed hyperlipidemia: Secondary | ICD-10-CM

## 2021-08-29 NOTE — Patient Instructions (Signed)
Medication Instructions:  Your physician recommends that you continue on your current medications as directed. Please refer to the Current Medication list given to you today.  *If you need a refill on your cardiac medications before your next appointment, please call your pharmacy*   Lab Work:  -NONE  If you have labs (blood work) drawn today and your tests are completely normal, you will receive your results only by: [place] (if you have MyChart) OR A paper copy in the mail If you have any lab test that is abnormal or we need to change your treatment, we will call you to review the results.   Testing/Procedures:  -NONE   Follow-Up: At [facility], you and your health needs are our priority.  As part of our continuing mission to provide you with exceptional heart care, we have created designated Provider Care Teams.  These Care Teams include your primary Cardiologist (physician) and Advanced Practice Providers (APPs -  Physician Assistants and Nurse Practitioners) who all work together to provide you with the care you need, when you need it.  We recommend signing up for the patient portal called "MyChart".  Sign up information is provided on this After Visit Summary.  MyChart is used to connect with patients for Virtual Visits (Telemedicine).  Patients are able to view lab/test results, encounter notes, upcoming appointments, etc.  Non-urgent messages can be sent to your provider as well.   To learn more about what you can do with MyChart, go to [email or web address].    Your next appointment:   8 month(s)  The format for your next appointment:   In Person  Provider:   You may see Sherren Mocha, MD or one of the following Advanced Practice Providers on your designated Care Team:   Richardson Dopp, PA-C   Other Instructions Your physician wants you to follow-up in: 8 month with Dr. Burt Knack or Richardson Dopp, PA-C.  You will receive a reminder letter in the mail two  months in advance. If you don't receive a letter, please call our office to schedule the follow-up appointment.

## 2021-09-27 ENCOUNTER — Other Ambulatory Visit: Payer: Self-pay | Admitting: Physician Assistant

## 2021-09-28 ENCOUNTER — Other Ambulatory Visit (HOSPITAL_BASED_OUTPATIENT_CLINIC_OR_DEPARTMENT_OTHER): Payer: Self-pay

## 2021-09-28 MED ORDER — ATORVASTATIN CALCIUM 40 MG PO TABS
ORAL_TABLET | ORAL | 11 refills | Status: DC
  Filled 2021-09-28: qty 90, 90d supply, fill #0
  Filled 2021-12-31: qty 90, 90d supply, fill #1
  Filled 2022-04-01: qty 90, 90d supply, fill #2
  Filled 2022-07-03: qty 90, 90d supply, fill #3

## 2021-10-29 ENCOUNTER — Other Ambulatory Visit (HOSPITAL_BASED_OUTPATIENT_CLINIC_OR_DEPARTMENT_OTHER): Payer: Self-pay

## 2021-11-28 ENCOUNTER — Other Ambulatory Visit (HOSPITAL_BASED_OUTPATIENT_CLINIC_OR_DEPARTMENT_OTHER): Payer: Self-pay

## 2021-12-31 ENCOUNTER — Other Ambulatory Visit (HOSPITAL_BASED_OUTPATIENT_CLINIC_OR_DEPARTMENT_OTHER): Payer: Self-pay

## 2022-01-31 ENCOUNTER — Other Ambulatory Visit (HOSPITAL_BASED_OUTPATIENT_CLINIC_OR_DEPARTMENT_OTHER): Payer: Self-pay

## 2022-02-18 DIAGNOSIS — Z1389 Encounter for screening for other disorder: Secondary | ICD-10-CM | POA: Diagnosis not present

## 2022-02-18 DIAGNOSIS — Z1272 Encounter for screening for malignant neoplasm of vagina: Secondary | ICD-10-CM | POA: Diagnosis not present

## 2022-02-18 DIAGNOSIS — Z01419 Encounter for gynecological examination (general) (routine) without abnormal findings: Secondary | ICD-10-CM | POA: Diagnosis not present

## 2022-02-18 DIAGNOSIS — Z124 Encounter for screening for malignant neoplasm of cervix: Secondary | ICD-10-CM | POA: Diagnosis not present

## 2022-02-18 DIAGNOSIS — Z6828 Body mass index (BMI) 28.0-28.9, adult: Secondary | ICD-10-CM | POA: Diagnosis not present

## 2022-02-18 DIAGNOSIS — Z0142 Encounter for cervical smear to confirm findings of recent normal smear following initial abnormal smear: Secondary | ICD-10-CM | POA: Diagnosis not present

## 2022-02-18 DIAGNOSIS — I2542 Coronary artery dissection: Secondary | ICD-10-CM | POA: Insufficient documentation

## 2022-02-18 DIAGNOSIS — Z1151 Encounter for screening for human papillomavirus (HPV): Secondary | ICD-10-CM | POA: Diagnosis not present

## 2022-02-18 DIAGNOSIS — Z1231 Encounter for screening mammogram for malignant neoplasm of breast: Secondary | ICD-10-CM | POA: Diagnosis not present

## 2022-02-18 DIAGNOSIS — Z130 Encounter for screening for diseases of the blood and blood-forming organs and certain disorders involving the immune mechanism: Secondary | ICD-10-CM | POA: Diagnosis not present

## 2022-03-02 ENCOUNTER — Other Ambulatory Visit (HOSPITAL_BASED_OUTPATIENT_CLINIC_OR_DEPARTMENT_OTHER): Payer: Self-pay

## 2022-03-04 ENCOUNTER — Other Ambulatory Visit (HOSPITAL_BASED_OUTPATIENT_CLINIC_OR_DEPARTMENT_OTHER): Payer: Self-pay

## 2022-03-22 IMAGING — DX DG CHEST 1V PORT
1 series · 1 of 1 positions shown · non-contrast
Comparison: None.

CLINICAL DATA: Status post CABG.

EXAM:
PORTABLE CHEST 1 VIEW

[chest]
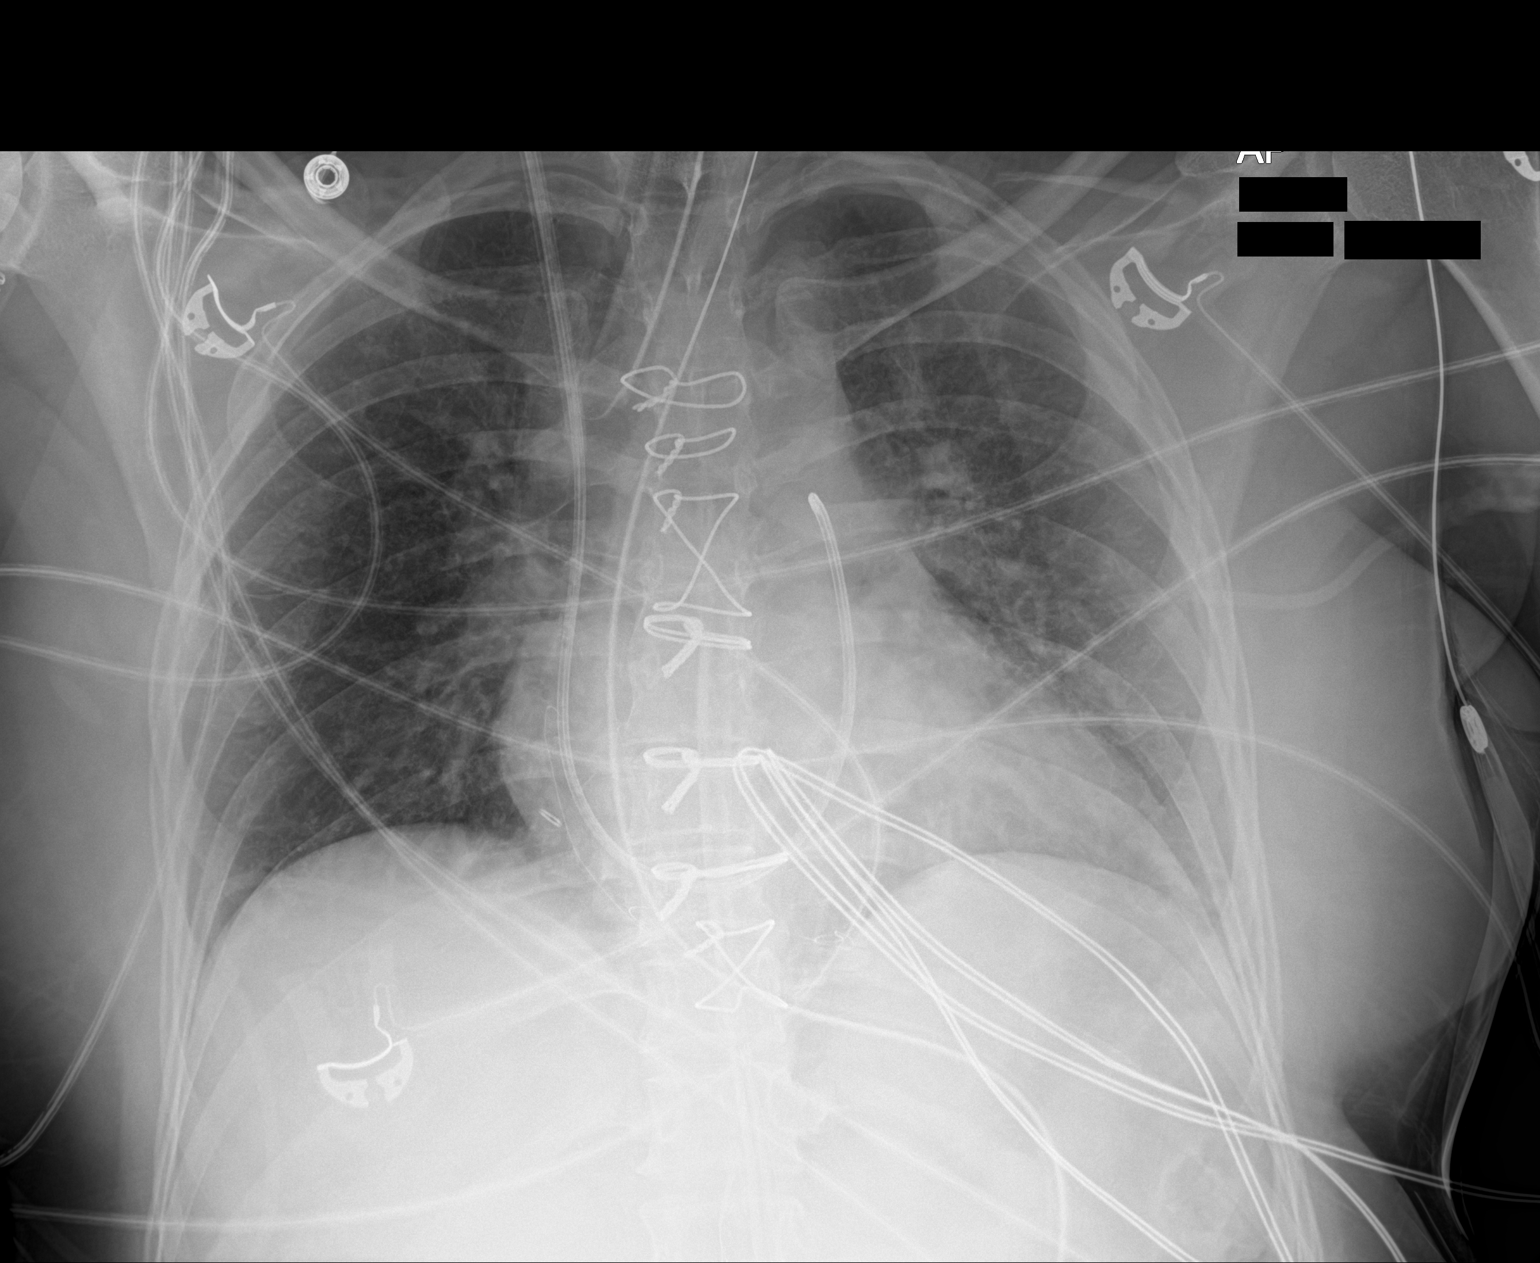

[1 of 1 positions shown; findings below may reference images not displayed]

FINDINGS: Endotracheal tube tip at the level of the clavicles, approximately
2.4 cm from the carina. Enteric tube in place with tip below the
diaphragm not included in the field of view. Right internal jugular
Swan-Ganz catheter tip in the region of the proximal outflow track.
Post median sternotomy. Mediastinal drains in place. Low lung
volumes. Upper normal heart size. Normal mediastinal contours for
technique. Vascular congestion. No pneumothorax. No significant
pleural effusion. Small amounts of subcutaneous gas in the left
supraclavicular region.
IMPRESSION: 1. Support apparatus as described.
2. Post median sternotomy. Low lung volumes with vascular
congestion.

## 2022-03-23 IMAGING — DX DG CHEST 1V PORT
1 series · 1 of 1 positions shown · non-contrast
Comparison: 04/25/2021.

CLINICAL DATA: Status post open heart surgery.  Sore chest.

EXAM:
PORTABLE CHEST 1 VIEW

[chest]
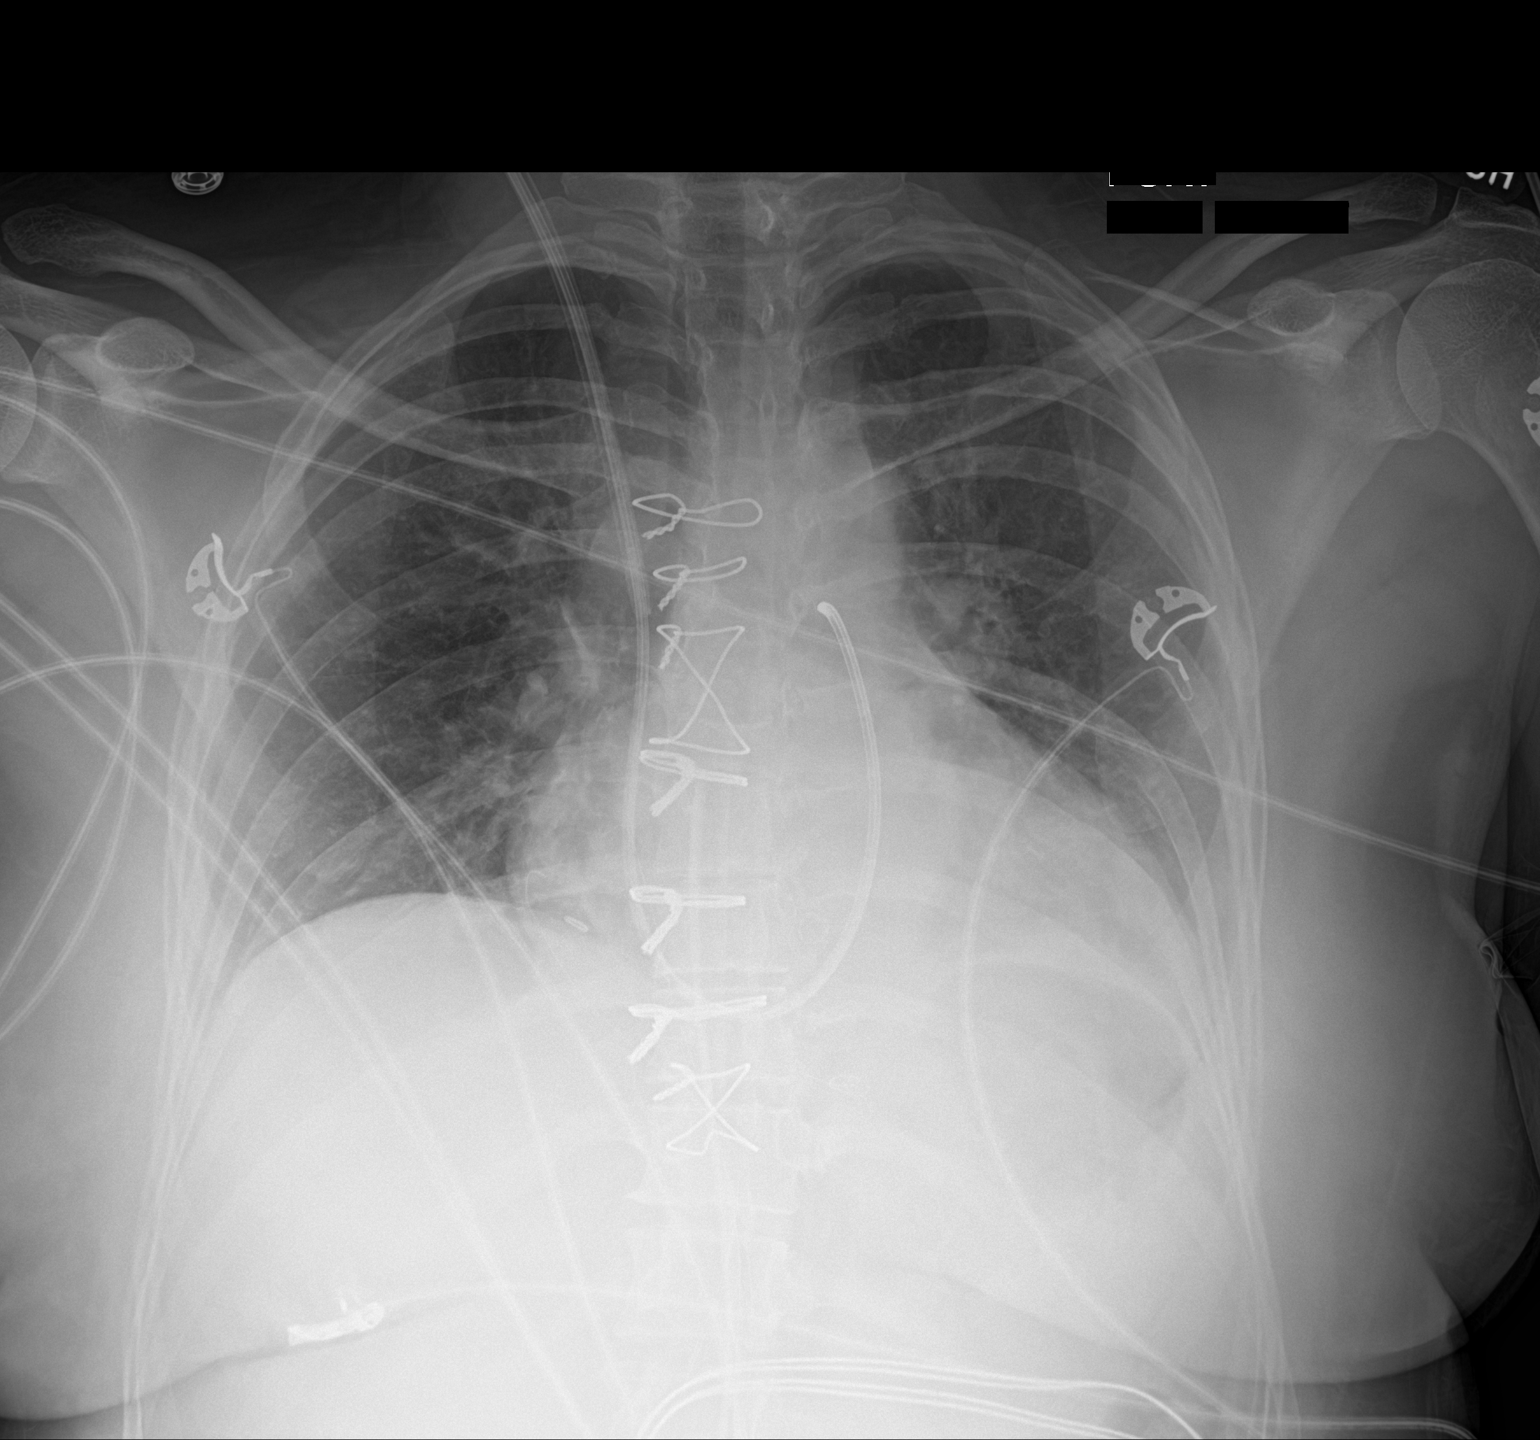

[1 of 1 positions shown; findings below may reference images not displayed]

FINDINGS: Interim extubation and removal of NG tube. Swan-Ganz catheter and
mediastinal drainage catheter in stable position. Prior median
sternotomy. Heart size normal. Low lung volumes with bibasilar
atelectasis. No pleural effusion or pneumothorax.
IMPRESSION: 1. Interim extubation and removal of NG tube. Swan-Ganz catheter
mediastinal drainage catheter stable position. No pneumothorax.

2.  Prior median sternotomy.  Heart size stable.

3.  Low lung volumes with bibasilar atelectasis.

## 2022-03-25 DIAGNOSIS — R7989 Other specified abnormal findings of blood chemistry: Secondary | ICD-10-CM | POA: Diagnosis not present

## 2022-04-01 ENCOUNTER — Other Ambulatory Visit: Payer: Self-pay | Admitting: Cardiology

## 2022-04-01 DIAGNOSIS — D225 Melanocytic nevi of trunk: Secondary | ICD-10-CM | POA: Diagnosis not present

## 2022-04-01 DIAGNOSIS — Z1283 Encounter for screening for malignant neoplasm of skin: Secondary | ICD-10-CM | POA: Diagnosis not present

## 2022-04-01 DIAGNOSIS — L905 Scar conditions and fibrosis of skin: Secondary | ICD-10-CM | POA: Diagnosis not present

## 2022-04-01 DIAGNOSIS — D485 Neoplasm of uncertain behavior of skin: Secondary | ICD-10-CM | POA: Diagnosis not present

## 2022-04-02 ENCOUNTER — Other Ambulatory Visit (HOSPITAL_BASED_OUTPATIENT_CLINIC_OR_DEPARTMENT_OTHER): Payer: Self-pay

## 2022-04-02 MED ORDER — METOPROLOL TARTRATE 25 MG PO TABS
ORAL_TABLET | ORAL | 6 refills | Status: DC
  Filled 2022-04-02: qty 30, 30d supply, fill #0
  Filled 2022-05-03: qty 30, 30d supply, fill #1
  Filled 2022-06-03: qty 30, 30d supply, fill #2
  Filled 2022-07-03: qty 30, 30d supply, fill #3
  Filled 2022-08-02: qty 30, 30d supply, fill #4
  Filled 2022-09-02: qty 30, 30d supply, fill #5
  Filled 2022-10-01: qty 30, 30d supply, fill #6

## 2022-04-15 DIAGNOSIS — D485 Neoplasm of uncertain behavior of skin: Secondary | ICD-10-CM | POA: Diagnosis not present

## 2022-04-15 DIAGNOSIS — L988 Other specified disorders of the skin and subcutaneous tissue: Secondary | ICD-10-CM | POA: Diagnosis not present

## 2022-05-04 ENCOUNTER — Other Ambulatory Visit (HOSPITAL_BASED_OUTPATIENT_CLINIC_OR_DEPARTMENT_OTHER): Payer: Self-pay

## 2022-05-06 ENCOUNTER — Other Ambulatory Visit (HOSPITAL_BASED_OUTPATIENT_CLINIC_OR_DEPARTMENT_OTHER): Payer: Self-pay

## 2022-06-03 ENCOUNTER — Other Ambulatory Visit (HOSPITAL_BASED_OUTPATIENT_CLINIC_OR_DEPARTMENT_OTHER): Payer: Self-pay

## 2022-07-04 ENCOUNTER — Other Ambulatory Visit (HOSPITAL_BASED_OUTPATIENT_CLINIC_OR_DEPARTMENT_OTHER): Payer: Self-pay

## 2022-08-02 ENCOUNTER — Other Ambulatory Visit (HOSPITAL_BASED_OUTPATIENT_CLINIC_OR_DEPARTMENT_OTHER): Payer: Self-pay

## 2022-08-17 NOTE — Progress Notes (Unsigned)
Office Visit    Patient Name: Anna Vang Date of Encounter: 08/17/2022  Primary Care Provider:  Merryl Hacker, No Primary Cardiologist:  Sherren Mocha, MD Primary Electrophysiologist: None  Chief Complaint    Anna Vang is a 46 y.o. female with PMH of CAD MI s/p CABG x1 following unsuccessful PCI due SCAD in 04/2021, ICM with recovered EF (55-60% 2022), who presents today for follow-up of coronary artery disease.  Past Medical History    Past Medical History:  Diagnosis Date   Anxiety    Past Surgical History:  Procedure Laterality Date   CESAREAN SECTION     CORONARY ANGIOGRAPHY N/A 04/25/2021   Procedure: CORONARY ANGIOGRAPHY;  Surgeon: Sherren Mocha, MD;  Location: [facility];  Service: Cardiovascular;  Laterality: N/A;   CORONARY ARTERY BYPASS GRAFT N/A 04/25/2021   Procedure: CORONARY ARTERY BYPASS GRAFTING (CABG) times one using right greater saphenous vein harvested endoscopically;  Surgeon: Wonda Olds, MD;  Location: [facility];  Service: Open Heart Surgery;  Laterality: N/A;   CORONARY/GRAFT ACUTE MI REVASCULARIZATION N/A 04/25/2021   Procedure: Coronary/Graft Acute MI Revascularization;  Surgeon: Sherren Mocha, MD;  Location: [facility];  Service: Cardiovascular;  Laterality: N/A;   ENDOVEIN HARVEST OF GREATER SAPHENOUS VEIN Right 04/25/2021   Procedure: ENDOVEIN HARVEST OF GREATER SAPHENOUS VEIN;  Surgeon: Wonda Olds, MD;  Location: [facility];  Service: Open Heart Surgery;  Laterality: Right;   LEFT HEART CATH AND CORONARY ANGIOGRAPHY N/A 04/25/2021   Procedure: LEFT HEART CATH AND CORONARY ANGIOGRAPHY;  Surgeon: Sherren Mocha, MD;  Location: [facility];  Service: Cardiovascular;  Laterality: N/A;   TEE WITHOUT CARDIOVERSION N/A 04/25/2021   Procedure: TRANSESOPHAGEAL ECHOCARDIOGRAM (TEE);  Surgeon: Wonda Olds, MD;  Location: [facility];  Service: Open Heart Surgery;  Laterality: N/A;    Allergies  No Known Allergies  History of  Present Illness    Anna Vang  is a 46year old female with the above mention past medical history who presents today for follow-up of coronary artery disease.  Ms. Rackers was last seen by Richardson Dopp, PA on 08/2021 for follow-up of CAD.  During visit patient reviewed recent diagnostic studies to rule out fibromuscular dysplasia due to previous scad.  Patient did have bleeding present in carotid arteries with elevated velocities in the right renal artery as well.  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  Patient was directed to follow-up in 8 months.  Since last being seen in the office patient reports***.  Patient denies chest pain, palpitations, dyspnea, PND, orthopnea, nausea, vomiting, dizziness, syncope, edema, weight gain, or early satiety.     ***Notes:  Home Medications    Current Outpatient Medications  Medication Sig Dispense Refill   Ascorbic Acid (VITAMIN C PO) Take 1 tablet by mouth daily.     aspirin EC 81 MG EC tablet Take 1 tablet (81 mg total) by mouth daily. Swallow whole. 30 tablet 11   atorvastatin (LIPITOR) 40 MG tablet Take 1 tablet by mouth every day at bedtime 30 tablet 11   cetirizine (ZYRTEC) 10 MG tablet Take 10 mg by mouth daily as needed for allergies.     folic acid (FOLVITE) 921 MCG tablet Take 400 mcg by mouth daily.     magnesium 30 MG tablet Take 30 mg by mouth daily.     metoprolol tartrate (LOPRESSOR) 25 MG tablet Take 1/2 tablet by mouth 2 times daily  30 tablet 6   Multiple Vitamins-Minerals (MULTIVITAMIN WITH MINERALS) tablet Take 1 tablet by mouth daily.     Probiotic Product (PROBIOTIC PO) Take 1 capsule by mouth daily.     VITAMIN D PO Take 1 tablet by mouth daily.     No current facility-administered medications for this visit.     Review of Systems  Please see the history of present illness.    (+)*** (+)***  All other systems reviewed and are otherwise negative except as noted above.  Physical Exam     Wt Readings from Last 3 Encounters:  08/29/21 167 lb (75.8 kg)  06/28/21 160 lb (72.6 kg)  05/28/21 161 lb (73 kg)   WU:[id number] were no vitals filed for this visit.,There is no height or weight on file to calculate BMI.  Constitutional:      Appearance: Healthy appearance. Not in distress.  Neck:     Vascular: JVD normal.  Pulmonary:     Effort: Pulmonary effort is normal.     Breath sounds: No wheezing. No rales. Diminished in the bases Cardiovascular:     Normal rate. Regular rhythm. Normal S1. Normal S2.      Murmurs: There is no murmur.  Edema:    Peripheral edema absent.  Abdominal:     Palpations: Abdomen is soft non tender. There is no hepatomegaly.  Skin:    General: Skin is warm and dry.  Neurological:     General: No focal deficit present.     Mental Status: Alert and oriented to person, place and time.     Cranial Nerves: Cranial nerves are intact.  EKG/LABS/Other Studies Reviewed    ECG personally reviewed by me today - ***  Risk Assessment/Calculations:   {Does this patient have ATRIAL FIBRILLATION?[phone]}        Lab Results  Component Value Date   WBC 10.3 08/20/2021   HGB 13.9 08/20/2021   HCT 41.7 08/20/2021   MCV 90 08/20/2021   PLT 352 08/20/2021   Lab Results  Component Value Date   CREATININE 0.77 08/20/2021   BUN 13 08/20/2021   NA 137 08/20/2021   K 4.4 08/20/2021   CL 105 08/20/2021   CO2 21 08/20/2021   Lab Results  Component Value Date   ALT 19 08/20/2021   AST 18 08/20/2021   ALKPHOS 80 08/20/2021   BILITOT 0.5 08/20/2021   Lab Results  Component Value Date   CHOL 155 08/20/2021   HDL 35 (L) 08/20/2021   LDLCALC 91 08/20/2021   LDLDIRECT 173.7 (H) 04/25/2021   TRIG 165 (H) 08/20/2021   CHOLHDL 4.4 08/20/2021    Lab Results  Component Value Date   HGBA1C 5.1 04/25/2021    Assessment & Plan    1.  Coronary artery disease: - s/p CABG x1 due to failed PCI resulting from SCAD -Patient reports today*** -Continue  GDMT with atorvastatin 40 mg, ASA 81 mg, and metoprolol 12.5 mg twice daily  2.  Fibromuscular dysplasia: -Patient has prior history of SCAD and had renal Dopplers and carotid Dopplers completed that showed some beading that could not rule out dysplasia.  -  3.***  4.***      Disposition: Follow-up with Sherren Mocha, MD or APP in *** months {Are you ordering a CV Procedure (e.g. stress test, cath, DCCV, TEE, etc)?   Press F2        :[id number]}   Medication Adjustments/Labs and Tests Ordered: Current medicines are reviewed at length with the patient  today.  Concerns regarding medicines are outlined above.   Signed, Mable Fill, Marissa Nestle, NP 08/17/2022, 12:39 PM [place]

## 2022-08-19 ENCOUNTER — Ambulatory Visit: Payer: 59 | Attending: Nurse Practitioner | Admitting: Nurse Practitioner

## 2022-08-19 ENCOUNTER — Encounter: Payer: Self-pay | Admitting: Nurse Practitioner

## 2022-08-19 VITALS — BP 130/78 | HR 55 | Ht 62.0 in | Wt 169.0 lb

## 2022-08-19 DIAGNOSIS — D225 Melanocytic nevi of trunk: Secondary | ICD-10-CM | POA: Diagnosis not present

## 2022-08-19 DIAGNOSIS — Z951 Presence of aortocoronary bypass graft: Secondary | ICD-10-CM

## 2022-08-19 DIAGNOSIS — E782 Mixed hyperlipidemia: Secondary | ICD-10-CM | POA: Diagnosis not present

## 2022-08-19 DIAGNOSIS — Z1283 Encounter for screening for malignant neoplasm of skin: Secondary | ICD-10-CM | POA: Diagnosis not present

## 2022-08-19 DIAGNOSIS — I773 Arterial fibromuscular dysplasia: Secondary | ICD-10-CM

## 2022-08-19 NOTE — Patient Instructions (Signed)
Medication Instructions:  Your physician recommends that you continue on your current medications as directed. Please refer to the Current Medication list given to you today.  *If you need a refill on your cardiac medications before your next appointment, please call your pharmacy*   Lab Work: Come back to the office, anytime after 7:15 a.m in the next few days, FASTING for:  LIPID & LFT  If you have labs (blood work) drawn today and your tests are completely normal, you will receive your results only by: [place] (if you have MyChart) OR A paper copy in the mail If you have any lab test that is abnormal or we need to change your treatment, we will call you to review the results.   Testing/Procedures: None ordered   Follow-Up: At [facility], you and your health needs are our priority.  As part of our continuing mission to provide you with exceptional heart care, we have created designated Provider Care Teams.  These Care Teams include your primary Cardiologist (physician) and Advanced Practice Providers (APPs -  Physician Assistants and Nurse Practitioners) who all work together to provide you with the care you need, when you need it.  We recommend signing up for the patient portal called "MyChart".  Sign up information is provided on this After Visit Summary.  MyChart is used to connect with patients for Virtual Visits (Telemedicine).  Patients are able to view lab/test results, encounter notes, upcoming appointments, etc.  Non-urgent messages can be sent to your provider as well.   To learn more about what you can do with MyChart, go to [email or web address].    Your next appointment:   1 year(s)  The format for your next appointment:   In Person  Provider:   Sherren Mocha, MD  or Ambrose Pancoast, NP         Other Instructions   Important Information About Sugar

## 2022-09-02 ENCOUNTER — Other Ambulatory Visit (HOSPITAL_BASED_OUTPATIENT_CLINIC_OR_DEPARTMENT_OTHER): Payer: Self-pay

## 2022-10-01 ENCOUNTER — Other Ambulatory Visit: Payer: Self-pay | Admitting: Cardiovascular Disease

## 2022-10-02 ENCOUNTER — Other Ambulatory Visit (HOSPITAL_BASED_OUTPATIENT_CLINIC_OR_DEPARTMENT_OTHER): Payer: Self-pay

## 2022-10-02 MED ORDER — ATORVASTATIN CALCIUM 40 MG PO TABS
40.0000 mg | ORAL_TABLET | Freq: Every day | ORAL | 11 refills | Status: DC
  Filled 2022-10-02: qty 30, 30d supply, fill #0
  Filled 2022-11-04: qty 30, 30d supply, fill #1
  Filled 2022-12-06 – 2022-12-09 (×2): qty 30, 30d supply, fill #2
  Filled 2023-01-05: qty 30, 30d supply, fill #3
  Filled 2023-02-04: qty 30, 30d supply, fill #4
  Filled 2023-03-06: qty 30, 30d supply, fill #5
  Filled 2023-04-09: qty 30, 30d supply, fill #6
  Filled 2023-05-08: qty 30, 30d supply, fill #7
  Filled 2023-06-09: qty 30, 30d supply, fill #8
  Filled 2023-07-09: qty 30, 30d supply, fill #9
  Filled 2023-08-08: qty 30, 30d supply, fill #10
  Filled 2023-09-08: qty 30, 30d supply, fill #11

## 2022-11-04 ENCOUNTER — Other Ambulatory Visit: Payer: Self-pay | Admitting: Cardiovascular Disease

## 2022-11-05 ENCOUNTER — Other Ambulatory Visit (HOSPITAL_BASED_OUTPATIENT_CLINIC_OR_DEPARTMENT_OTHER): Payer: Self-pay

## 2022-11-05 MED ORDER — METOPROLOL TARTRATE 25 MG PO TABS
12.5000 mg | ORAL_TABLET | Freq: Two times a day (BID) | ORAL | 7 refills | Status: DC
  Filled 2022-11-05: qty 30, 30d supply, fill #0
  Filled 2022-12-06 – 2022-12-09 (×2): qty 30, 30d supply, fill #1
  Filled 2023-01-05: qty 30, 30d supply, fill #2
  Filled 2023-02-04: qty 30, 30d supply, fill #3
  Filled 2023-03-06: qty 30, 30d supply, fill #4
  Filled 2023-04-09: qty 30, 30d supply, fill #5
  Filled 2023-05-08: qty 30, 30d supply, fill #6
  Filled 2023-06-09: qty 30, 30d supply, fill #7

## 2022-12-09 ENCOUNTER — Other Ambulatory Visit: Payer: Self-pay

## 2022-12-09 ENCOUNTER — Other Ambulatory Visit (HOSPITAL_BASED_OUTPATIENT_CLINIC_OR_DEPARTMENT_OTHER): Payer: Self-pay

## 2023-01-16 ENCOUNTER — Encounter: Payer: Self-pay | Admitting: Family Medicine

## 2023-01-16 ENCOUNTER — Ambulatory Visit: Payer: 59 | Admitting: Family Medicine

## 2023-01-16 VITALS — BP 116/50 | HR 54 | Temp 98.0°F | Resp 16 | Ht 62.0 in | Wt 157.6 lb

## 2023-01-16 DIAGNOSIS — E782 Mixed hyperlipidemia: Secondary | ICD-10-CM | POA: Diagnosis not present

## 2023-01-16 DIAGNOSIS — Z23 Encounter for immunization: Secondary | ICD-10-CM

## 2023-01-16 DIAGNOSIS — Z951 Presence of aortocoronary bypass graft: Secondary | ICD-10-CM | POA: Diagnosis not present

## 2023-01-16 DIAGNOSIS — E559 Vitamin D deficiency, unspecified: Secondary | ICD-10-CM

## 2023-01-16 LAB — CBC WITH DIFFERENTIAL/PLATELET
Basophils Absolute: 0 10*3/uL (ref 0.0–0.1)
Basophils Relative: 0.3 % (ref 0.0–3.0)
Eosinophils Absolute: 0 10*3/uL (ref 0.0–0.7)
Eosinophils Relative: 0.5 % (ref 0.0–5.0)
HCT: 45.3 % (ref 36.0–46.0)
Hemoglobin: 15.2 g/dL — ABNORMAL HIGH (ref 12.0–15.0)
Lymphocytes Relative: 20.7 % (ref 12.0–46.0)
Lymphs Abs: 2.1 10*3/uL (ref 0.7–4.0)
MCHC: 33.6 g/dL (ref 30.0–36.0)
MCV: 93.5 fl (ref 78.0–100.0)
Monocytes Absolute: 0.6 10*3/uL (ref 0.1–1.0)
Monocytes Relative: 5.5 % (ref 3.0–12.0)
Neutro Abs: 7.5 10*3/uL (ref 1.4–7.7)
Neutrophils Relative %: 73 % (ref 43.0–77.0)
Platelets: 357 10*3/uL (ref 150.0–400.0)
RBC: 4.85 Mil/uL (ref 3.87–5.11)
RDW: 13.3 % (ref 11.5–15.5)
WBC: 10.3 10*3/uL (ref 4.0–10.5)

## 2023-01-16 LAB — LDL CHOLESTEROL, DIRECT: Direct LDL: 80 mg/dL

## 2023-01-16 LAB — VITAMIN D 25 HYDROXY (VIT D DEFICIENCY, FRACTURES): VITD: 37.07 ng/mL (ref 30.00–100.00)

## 2023-01-16 LAB — COMPREHENSIVE METABOLIC PANEL
ALT: 21 U/L (ref 0–35)
AST: 18 U/L (ref 0–37)
Albumin: 3.6 g/dL (ref 3.5–5.2)
Alkaline Phosphatase: 64 U/L (ref 39–117)
BUN: 13 mg/dL (ref 6–23)
CO2: 27 mEq/L (ref 19–32)
Calcium: 9.4 mg/dL (ref 8.4–10.5)
Chloride: 102 mEq/L (ref 96–112)
Creatinine, Ser: 0.81 mg/dL (ref 0.40–1.20)
GFR: 88.15 mL/min (ref >60.00)
Glucose, Bld: 79 mg/dL (ref 70–99)
Potassium: 4.8 mEq/L (ref 3.5–5.1)
Sodium: 136 mEq/L (ref 135–145)
Total Bilirubin: 0.7 mg/dL (ref 0.2–1.2)
Total Protein: 6.6 g/dL (ref 6.0–8.3)

## 2023-01-16 LAB — LIPID PANEL
Cholesterol: 142 mg/dL (ref 0–200)
HDL: 39.6 mg/dL (ref >39.00)
NonHDL: 102.22
Total CHOL/HDL Ratio: 4
Triglycerides: 208 mg/dL — ABNORMAL HIGH (ref 0.0–149.0)
VLDL: 41.6 mg/dL — ABNORMAL HIGH (ref 0.0–40.0)

## 2023-01-16 NOTE — Assessment & Plan Note (Signed)
Stable. Asymptomatic. Following with cardiology and currently prescribed: Lipitor 40 mg daily, metoprolol tartrate 25 mg BID, Omega-3, aspirin 81 mg daily Patient aware of signs/symptoms requiring further/urgent evaluation.

## 2023-01-16 NOTE — Patient Instructions (Signed)
Thank you for choosing [facility] at [facility] for your Primary Care needs. I am excited for the opportunity to partner with you to meet your health care goals. It was a pleasure meeting you today!  Information on diet, exercise, and health maintenance recommendations are listed below. This is information to help you be sure you are on track for optimal health and monitoring.   Please look over this and let us know if you have any questions or if you have completed any of the health maintenance outside of [place] so that we can be sure your records are up to date.  ___________________________________________________________  MyChart:  For all urgent or time sensitive needs we ask that you please call the office to avoid delays. Our number is [phone]. MyChart is not constantly monitored and due to the large volume of messages a day, replies may take up to 72 business hours.  MyChart Policy: MyChart allows for you to see your visit notes, after visit summary, provider recommendations, lab and tests results, make an appointment, request refills, and contact your provider or the office for non-urgent questions or concerns. Providers are seeing patients during normal business hours and do not have built in time to review MyChart messages.  We ask that you allow a minimum of 3 business days for responses to [employer]. For this reason, please do not send urgent requests through [place]. Please call the office at [phone]. New and ongoing conditions may require a visit. We have virtual and in-person visits available for your convenience.  Complex MyChart concerns may require a visit. Your provider may request you schedule a virtual or in-person visit to ensure we are providing the best care possible. MyChart messages sent after 11:00 AM on Friday will not be received by the provider until Monday morning.    Lab and Test Results: You will receive your lab and test  results on MyChart as soon as they are completed and results have been sent by the lab or testing facility. Due to this service, you will receive your results BEFORE your provider.  I review lab and test results each morning prior to seeing patients. Some results require collaboration with other providers to ensure you are receiving the most appropriate care. For this reason, we ask that you please allow a minimum of 3-5 business days from the time that ALL results have been received for your provider to receive and review lab and test results and contact you about these.  Most lab and test result comments from the provider will be sent through [place]. Your provider may recommend changes to the plan of care, follow-up visits, repeat testing, ask questions, or request an office visit to discuss these results. You may reply directly to this message or call the office to provide information for the provider or set up an appointment. In some instances, you will be called with test results and recommendations. Please let us know if this is preferred and we will make note of this in your chart to provide this for you.    If you have not heard a response to your lab or test results in 5 business days from all results returning to [place], please call the office to let us know. We ask that you please avoid calling prior to this time unless there is an emergent concern. Due to high call volumes, this can delay the resulting process.  After Hours: For all non-emergency after hours needs, please  call the office at [phone] and select the option to reach the on-call  service. On-call services are shared between multiple [place] offices and therefore it will not be possible to speak directly with your provider. On-call providers may provide medical advice and recommendations, but are unable to provide refills for maintenance medications.  For all emergency or urgent medical needs after normal business hours, we  recommend that you seek care at the closest Urgent Care or Emergency Department to ensure appropriate treatment in a timely manner.  [facility] has a 24 hour emergency room located on the ground floor for your convenience.   Urgent Concerns During the Business Day Providers are seeing patients from 8AM to [place] with a busy schedule and are most often not able to respond to non-urgent calls until the end of the day or the next business day. If you should have URGENT concerns during the day, please call and speak to the nurse or schedule a same day appointment so that we can address your concern without delay.   Thank you, again, for choosing me as your health care partner. I appreciate your trust and look forward to learning more about you!   Anna Nails Olevia Bowens, DNP, FNP-C  ___________________________________________________________  Health Maintenance Recommendations Screening Testing Mammogram Every 1-2 years based on history and risk factors Starting at age 59 Pap Smear Ages 21-39 every 3 years Ages 33-65 every 5 years with HPV testing More frequent testing may be required based on results and history Colon Cancer Screening Every 1-10 years based on test performed, risk factors, and history Starting at age 33 Bone Density Screening Every 2-10 years based on history Starting at age 64 for women Recommendations for men differ based on medication usage, history, and risk factors AAA Screening One time ultrasound Men 5-53 years old who have ever smoked Lung Cancer Screening Low Dose Lung CT every 12 months Age 52-80 years with a 20 pack-year smoking history who still smoke or who have quit within the last 15 years  Screening Labs Routine  Labs: Complete Blood Count (CBC), Complete Metabolic Panel (CMP), Cholesterol (Lipid Panel) Every 6-12 months based on history and medications May be recommended more frequently based on current conditions or previous results Hemoglobin  A1c Lab Every 3-12 months based on history and previous results Starting at age 58 or earlier with diagnosis of diabetes, high cholesterol, BMI >26, and/or risk factors Frequent monitoring for patients with diabetes to ensure blood sugar control Thyroid Panel  Every 6 months based on history, symptoms, and risk factors May be repeated more often if on medication HIV One time testing for all patients 92 and older May be repeated more frequently for patients with increased risk factors or exposure Hepatitis C One time testing for all patients 61 and older May be repeated more frequently for patients with increased risk factors or exposure Gonorrhea, Chlamydia Every 12 months for all sexually active persons 13-24 years Additional monitoring may be recommended for those who are considered high risk or who have symptoms PSA Men 94-59 years old with risk factors Additional screening may be recommended from age 3-69 based on risk factors, symptoms, and history  Vaccine Recommendations Tetanus Booster All adults every 10 years Flu Vaccine All patients 6 months and older every year COVID Vaccine All patients 12 years and older Initial dosing with booster May recommend additional booster based on age and health history HPV Vaccine 2 doses all patients age 6-26 Dosing may be considered  for patients over 26 Shingles Vaccine (Shingrix) 2 doses all adults 30 years and older Pneumonia (Pneumovax 23) All adults 40 years and older May recommend earlier dosing based on health history Pneumonia (Prevnar 24) All adults 47 years and older Dosed 1 year after Pneumovax 23 Pneumonia (Prevnar 14) All adults 66 years and older (adults [id number] with certain conditions or risk factors) 1 dose  For those who have not received Prevnar 13 vaccine previously   Additional Screening, Testing, and Vaccinations may be recommended on an individualized basis based on family history, health history, risk  factors, and/or exposure.  __________________________________________________________  Diet Recommendations for All Patients  I recommend that all patients maintain a diet low in saturated fats, carbohydrates, and cholesterol. While this can be challenging at first, it is not impossible and small changes can make big differences.  Things to try: Decreasing the amount of soda, sweet tea, and/or juice to one or less per day and replace with water While water is always the first choice, if you do not like water you may consider adding a water additive without sugar to improve the taste other sugar free drinks Replace potatoes with a brightly colored vegetable  Use healthy oils, such as canola oil or olive oil, instead of butter or hard margarine Limit your bread intake to two pieces or less a day Replace regular pasta with low carb pasta options Bake, broil, or grill foods instead of frying Monitor portion sizes  Eat smaller, more frequent meals throughout the day instead of large meals  An important thing to remember is, if you love foods that are not great for your health, you don't have to give them up completely. Instead, allow these foods to be a reward when you have done well. Allowing yourself to still have special treats every once in a while is a nice way to tell yourself thank you for working hard to keep yourself healthy.   Also remember that every day is a new day. If you have a bad day and "fall off the wagon", you can still climb right back up and keep moving along on your journey!  We have resources available to help you!  Some websites that may be helpful include: www.http://carter.biz/  Www.VeryWellFit.com _____________________________________________________________  Activity Recommendations for All Patients  I recommend that all adults get at least 20 minutes of moderate physical activity that elevates your heart rate at least 5 days out of the week.  Some examples  include: Walking or jogging at a pace that allows you to carry on a conversation Cycling (stationary bike or outdoors) Water aerobics Yoga Weight lifting Dancing If physical limitations prevent you from putting stress on your joints, exercise in a pool or seated in a chair are excellent options.  Do determine your MAXIMUM heart rate for activity: 220 - YOUR AGE = MAX Heart Rate   Remember! Do not push yourself too hard.  Start slowly and build up your pace, speed, weight, time in exercise, etc.  Allow your body to rest between exercise and get good sleep. You will need more water than normal when you are exerting yourself. Do not wait until you are thirsty to drink. Drink with a purpose of getting in at least 8, 8 ounce glasses of water a day plus more depending on how much you exercise and sweat.    If you begin to develop dizziness, chest pain, abdominal pain, jaw pain, shortness of breath, headache, vision changes, lightheadedness, or other concerning symptoms,  stop the activity and allow your body to rest. If your symptoms are severe, seek emergency evaluation immediately. If your symptoms are concerning, but not severe, please let us know so that we can recommend further evaluation.

## 2023-01-16 NOTE — Assessment & Plan Note (Signed)
-  Reviewed most recent lipid panel -Medication management: continue lipitor -Repeat CMP and lipid panel today -Diet low in saturated fat -Regular exercise - at least 30 minutes, 5 times per week

## 2023-01-16 NOTE — Progress Notes (Signed)
Blood counts are stable Triglycerides are elevated (not fasting). LDL is 80, improved. Continue current meds and keep routine follow-ups with [place] and cardiology. Vit D is normal Metabolic panel is normal  Lifestyle factors for lowering cholesterol include: Diet therapy - heart-healthy diet rich in fruits, veggies, fiber-rich whole grains, lean meats, chicken, fish (at least twice a week), fat-free or 1% dairy products; foods low in saturated/trans fats, cholesterol, sodium, and sugar. Mediterranean diet has shown to be very heart healthy. Regular exercise - recommend at least 30 minutes a day, 5 times per week Weight management

## 2023-01-16 NOTE — Assessment & Plan Note (Signed)
Continue OTC supplementation Labs today

## 2023-01-16 NOTE — Progress Notes (Signed)
New Patient Office Visit  Subjective    Patient ID: PHYLLICIA Vang, female    DOB: [birth year]  Age: 47 y.o. MRN: [id number]  CC:  Chief Complaint  Patient presents with   Establish Care    HPI Anna Vang presents to establish care. She has not had primary care before. She follows with [facility] and [facility]. She is a recent widow - her husband passed from diabetes complication in December 2023. She has two teenage children.    CAD, s/p CABG, HLD: - In 2022, patient was blow drying her hair when she had severe chest pain. 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 Current medications include Lipitor 40 mg daily, metoprolol tartrate 25 mg BID, Omega-3, aspirin 81 mg daily. She follows with cardiology annually. Her primary cardiologist is Dr. Burt Knack.   Reports history of vitamin D deficiency. She is currently taking OTC Vitamin D3 50 mcg (2,000 units).  Reports she follows with [facility] and is up to date on her pap and mammogram - we will request records.      Outpatient Encounter Medications as of 01/16/2023  Medication Sig   Ascorbic Acid (VITAMIN C) 500 MG CAPS Take by mouth.   aspirin EC 81 MG EC tablet Take 1 tablet (81 mg total) by mouth daily. Swallow whole.   atorvastatin (LIPITOR) 40 MG tablet Take 1 tablet (40 mg total) by mouth at bedtime.   Calcium Carb-Cholecalciferol (CALCIUM 600 + D PO) Take by mouth.   cetirizine (ZYRTEC) 10 MG tablet Take 10 mg by mouth daily as needed for allergies.   cyanocobalamin (VITAMIN B12) 1000 MCG tablet Take 1,000 mcg by mouth daily.   folic acid (FOLVITE) Q000111Q MCG tablet Take 800 mcg by mouth daily.   magnesium 30 MG tablet Take 30 mg by mouth daily.   magnesium oxide (MAG-OX) 400 (240 Mg) MG tablet Take 400 mg by mouth daily.   metoprolol tartrate (LOPRESSOR) 25 MG tablet Take 0.5 tablets (12.5 mg total) by mouth in the morning and at bedtime.   Multiple Vitamins-Minerals  (MULTIVITAMIN WITH MINERALS) tablet Take 1 tablet by mouth daily.   Omega-3 Fatty Acids (FISH OIL) 1200 MG CAPS Take by mouth.   VITAMIN D PO Take 1 tablet by mouth daily.   [DISCONTINUED] Ascorbic Acid (VITAMIN C PO) Take 1 tablet by mouth daily.   [DISCONTINUED] folic acid (FOLVITE) A999333 MCG tablet Take 400 mcg by mouth daily.   [DISCONTINUED] Probiotic Product (PROBIOTIC PO) Take 1 capsule by mouth daily.   No facility-administered encounter medications on file as of 01/16/2023.    Past Medical History:  Diagnosis Date   Anxiety     Past Surgical History:  Procedure Laterality Date   CESAREAN SECTION     CORONARY ANGIOGRAPHY N/A 04/25/2021   Procedure: CORONARY ANGIOGRAPHY;  Surgeon: Sherren Mocha, MD;  Location: [place] [facility];  Service: Cardiovascular;  Laterality: N/A;   CORONARY ARTERY BYPASS GRAFT N/A 04/25/2021   Procedure: CORONARY ARTERY BYPASS GRAFTING (CABG) times one using right greater saphenous vein harvested endoscopically;  Surgeon: Wonda Olds, MD;  Location: [place];  Service: Open Heart Surgery;  Laterality: N/A;   CORONARY/GRAFT ACUTE MI REVASCULARIZATION N/A 04/25/2021   Procedure: Coronary/Graft Acute MI Revascularization;  Surgeon: Sherren Mocha, MD;  Location: [place] [facility];  Service: Cardiovascular;  Laterality: N/A;   ENDOVEIN HARVEST OF GREATER SAPHENOUS VEIN Right 04/25/2021   Procedure: ENDOVEIN HARVEST OF GREATER SAPHENOUS VEIN;  Surgeon: Wonda Olds, MD;  Location: [place];  Service: Open Heart Surgery;  Laterality: Right;   LEFT HEART CATH AND CORONARY ANGIOGRAPHY N/A 04/25/2021   Procedure: LEFT HEART CATH AND CORONARY ANGIOGRAPHY;  Surgeon: Sherren Mocha, MD;  Location: [place] [facility];  Service: Cardiovascular;  Laterality: N/A;   TEE WITHOUT CARDIOVERSION N/A 04/25/2021   Procedure: TRANSESOPHAGEAL ECHOCARDIOGRAM (TEE);  Surgeon: Wonda Olds, MD;  Location: [place];  Service: Open Heart Surgery;  Laterality: N/A;    Family  History  Problem Relation Age of Onset   Mental illness Mother    Kidney disease Mother    Hypertension Father    Diabetes Father    Hypertension Brother     Social History   Socioeconomic History   Marital status: Widowed    Spouse name: Not on file   Number of children: Not on file   Years of education: Not on file   Highest education level: Not on file  Occupational History   Not on file  Tobacco Use   Smoking status: Never   Smokeless tobacco: Never  Substance and Sexual Activity   Alcohol use: Yes    Alcohol/week: 2.0 standard drinks of alcohol    Types: 2 Glasses of wine per week   Drug use: No   Sexual activity: Yes    Birth control/protection: None  Other Topics Concern   Not on file  Social History Narrative   Not on file   Social Determinants of Health   Financial Resource Strain: Not on file  Food Insecurity: Not on file  Transportation Needs: Not on file  Physical Activity: Not on file  Stress: Not on file  Social Connections: Not on file  Intimate Partner Violence: Not on file    ROS All review of systems negative except what is listed in the HPI      Objective    BP (!) 116/50   Pulse (!) 54   Temp 98 F (36.7 C)   Resp 16   Ht 5' 2"$  (1.575 m)   Wt 157 lb 9.6 oz (71.5 kg)   SpO2 100%   BMI 28.83 kg/m   Physical Exam Vitals reviewed.  Constitutional:      Appearance: Normal appearance.  Cardiovascular:     Rate and Rhythm: Normal rate and regular rhythm.     Pulses: Normal pulses.     Heart sounds: Normal heart sounds.  Pulmonary:     Effort: Pulmonary effort is normal.     Breath sounds: Normal breath sounds.  Skin:    General: Skin is warm and dry.  Neurological:     Mental Status: She is alert and oriented to person, place, and time.  Psychiatric:        Mood and Affect: Mood normal.        Behavior: Behavior normal.        Thought Content: Thought content normal.        Judgment: Judgment normal.          Assessment & Plan:   Problem List Items Addressed This Visit       Other   S/P CABG x 1    Stable. Asymptomatic. Following with cardiology and currently prescribed: Lipitor 40 mg daily, metoprolol tartrate 25 mg BID, Omega-3, aspirin 81 mg daily Patient aware of signs/symptoms requiring further/urgent evaluation.       Relevant Orders   CBC with Differential/Platelet (Completed)   Comprehensive metabolic panel (Completed)  Lipid panel (Completed)   Vitamin D deficiency - Primary    Continue OTC supplementation Labs today       Relevant Orders   Vitamin D (25 hydroxy) (Completed)   Mixed hyperlipidemia    -Reviewed most recent lipid panel -Medication management: continue lipitor -Repeat CMP and lipid panel today -Diet low in saturated fat -Regular exercise - at least 30 minutes, 5 times per week       Relevant Orders   Comprehensive metabolic panel (Completed)   Lipid panel (Completed)   Other Visit Diagnoses     Need for influenza vaccination       Relevant Orders   Flu Vaccine QUAD 36+ mos IM (Fluarix, Fluzone & Afluria Quad PF (Completed)       Return in about 6 months (around 07/17/2023) for routine follow-up/CPE.   Terrilyn Saver, NP

## 2023-01-31 ENCOUNTER — Encounter: Payer: Self-pay | Admitting: Family Medicine

## 2023-07-09 ENCOUNTER — Other Ambulatory Visit: Payer: Self-pay | Admitting: Nurse Practitioner

## 2023-07-10 ENCOUNTER — Other Ambulatory Visit (HOSPITAL_BASED_OUTPATIENT_CLINIC_OR_DEPARTMENT_OTHER): Payer: Self-pay

## 2023-07-10 MED ORDER — METOPROLOL TARTRATE 25 MG PO TABS
12.5000 mg | ORAL_TABLET | Freq: Two times a day (BID) | ORAL | 1 refills | Status: DC
  Filled 2023-07-10: qty 30, 30d supply, fill #0
  Filled 2023-08-08: qty 30, 30d supply, fill #1

## 2023-09-08 ENCOUNTER — Other Ambulatory Visit: Payer: Self-pay | Admitting: Cardiovascular Disease

## 2023-09-09 ENCOUNTER — Telehealth: Payer: Self-pay | Admitting: Cardiovascular Disease

## 2023-09-09 ENCOUNTER — Other Ambulatory Visit (HOSPITAL_BASED_OUTPATIENT_CLINIC_OR_DEPARTMENT_OTHER): Payer: Self-pay

## 2023-09-09 MED ORDER — METOPROLOL TARTRATE 25 MG PO TABS
12.5000 mg | ORAL_TABLET | Freq: Two times a day (BID) | ORAL | 0 refills | Status: DC
  Filled 2023-09-09: qty 30, 30d supply, fill #0

## 2023-09-09 NOTE — Telephone Encounter (Signed)
Pt's medication was sent to pt's pharmacy as requested. Confirmation received.  °

## 2023-09-09 NOTE — Telephone Encounter (Signed)
*  STAT* If patient is at the pharmacy, call can be transferred to refill team.   1. Which medications need to be refilled? (please list name of each medication and dose if known) metoprolol tartrate (LOPRESSOR) 25 MG tablet    2. Would you like to learn more about the convenience, safety, & potential cost savings by using the [facility]?    3. Are you open to using the [facility] (Type [facility].    4. Which pharmacy/location (including street and city if local pharmacy) is medication to be sent to?[facility]    5. Do they need a 30 day or 90 day supply? 90 days

## 2023-09-11 ENCOUNTER — Other Ambulatory Visit (HOSPITAL_BASED_OUTPATIENT_CLINIC_OR_DEPARTMENT_OTHER): Payer: Self-pay

## 2023-09-11 MED ORDER — SOD FLUORIDE-POTASSIUM NITRATE 1.1-5 % DT GEL
1.0000 | Freq: Two times a day (BID) | DENTAL | 2 refills | Status: DC
  Filled 2023-09-11: qty 100, 30d supply, fill #0
  Filled 2023-12-02: qty 100, 30d supply, fill #1
  Filled 2024-04-08: qty 100, 30d supply, fill #2

## 2023-09-14 NOTE — Progress Notes (Unsigned)
Cardiology Office Note:    Date:  09/15/2023   ID:  Anna Vang, DOB 11-Oct-1976, MRN [id number]  PCP:  Clayborne Dana, NP   [facility] Providers Cardiologist:  Tonny Bollman, MD     Referring MD: Clayborne Dana, NP   Chief Complaint  Patient presents with   Coronary Artery Disease    History of Present Illness:    Anna Vang is a 47 y.o. 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 BP PCI unsuccessful >> s/p CABG x 1 (S-RCA) Ischemic CM EF 45-50, inf [place] Echocardiogram 7/22: EF 55-60, no RWMA  ?Fibromuscular dysplasia Carotid US 7/22: minimal beading, cannot exclude FMD Renal artery [place] 7/22: cannot r/o FMD   The patient is here alone today. Since I saw her last, her husband died in Dec 04, 2023 of a heart attack. She is doing ok and adjusting to parenting alone. She has a 45 yo son and a 45 yo daughter. Today, she denies symptoms of chest pain, shortness of breath, orthopnea, PND, lower extremity edema, dizziness, or syncope. She has some palpitations when she's resting at night.     Current Medications: Current Meds  Medication Sig   Ascorbic Acid (VITAMIN C) 500 MG CAPS Take by mouth.   aspirin EC 81 MG EC tablet Take 1 tablet (81 mg total) by mouth daily. Swallow whole.   atorvastatin (LIPITOR) 40 MG tablet Take 1 tablet (40 mg total) by mouth at bedtime.   Calcium Carb-Cholecalciferol (CALCIUM 600 + D PO) Take by mouth.   cetirizine (ZYRTEC) 10 MG tablet Take 10 mg by mouth daily as needed for allergies.   cyanocobalamin (VITAMIN B12) 1000 MCG tablet Take 1,000 mcg by mouth daily.   folic acid (FOLVITE) 800 MCG tablet Take 800 mcg by mouth daily.   magnesium oxide (MAG-OX) 400 (240 Mg) MG tablet Take 400 mg by mouth daily.   Multiple Vitamins-Minerals (MULTIVITAMIN WITH MINERALS) tablet Take 1 tablet by mouth daily.   Omega-3 Fatty Acids  (FISH OIL) 1200 MG CAPS Take by mouth.   Sod Fluoride-Potassium Nitrate (SODIUM FLUORIDE 5000 SENSITIVE) 1.1-5 % GEL Use as directed to brush teeth 2 (two) to 3 (three) times daily.   VITAMIN D PO Take 1 tablet by mouth daily.   [DISCONTINUED] magnesium 30 MG tablet Take 30 mg by mouth daily.   [DISCONTINUED] metoprolol tartrate (LOPRESSOR) 25 MG tablet Take 0.5 tablets (12.5 mg total) by mouth in the morning and at bedtime.     Allergies:   Patient has no known allergies.   ROS:   Please see the history of present illness.    All other systems reviewed and are negative.  EKGs/Labs/Other Studies Reviewed:    The following studies were reviewed today: Cardiac Studies & Procedures   CARDIAC CATHETERIZATION  CARDIAC CATHETERIZATION 04/25/2021  Narrative 1.  Widely patent left main, LAD, and left circumflex 2.  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  PCI is attempted but is unsuccessful because of extension of the coronary dissection with balloon angioplasty and inability to restore flow into the vessel.  Findings Coronary Findings Diagnostic  Dominance: Right  Left Main Vessel is angiographically normal.  Left Circumflex Vessel is angiographically normal.  Right Coronary Artery Prox RCA to Mid RCA  lesion is 99% stenosed. Diffuse stenosis, typical appearance of scad, TIMI II flow at baseline  Intervention  No interventions have been documented.     ECHOCARDIOGRAM  ECHOCARDIOGRAM COMPLETE 06/20/2021  Narrative ECHOCARDIOGRAM REPORT    Patient Name:   Anna Vang Date of Exam: 06/20/2021 Medical Rec #:  [id number]       Height:       62.0 in Accession #:    [id number]      Weight:       161.0 lb Date of Birth:  09-28-1976       BSA:          1.743 m Patient Age:    45 years        BP:           130/72 mmHg Patient Gender: F               HR:            58 bpm. Exam Location:  [address]  Procedure: 2D Echo, 3D Echo, Cardiac Doppler, Color Doppler and Strain Analysis  Indications:    I21.3 STEMI  History:        Patient has prior history of Echocardiogram examinations, most recent 04/25/2021. Anxiety.  Sonographer:    Sedonia Small Rodgers-Jones RDCS Referring Phys: [id number] Lindley Hiney  IMPRESSIONS   1. Left ventricular ejection fraction, by estimation, is 55 to 60%. The left ventricle has normal function. The left ventricle has no regional wall motion abnormalities. Left ventricular diastolic parameters were normal. The average left ventricular global longitudinal strain is -21.4 %. The global longitudinal strain is normal. 2. Right ventricular systolic function is normal. The right ventricular size is normal. 3. The mitral valve is normal in structure. Trivial mitral valve regurgitation. No evidence of mitral stenosis. 4. The aortic valve is tricuspid. Aortic valve regurgitation is not visualized. No aortic stenosis is present. 5. The inferior vena cava is normal in size with greater than 50% respiratory variability, suggesting right atrial pressure of 3 mmHg.  Comparison(s): No prior chest wall echo, TEE images pre/post bypass reviewed.  Conclusion(s)/Recommendation(s): Normal biventricular function without evidence of hemodynamically significant valvular heart disease.  FINDINGS Left Ventricle: Left ventricular ejection fraction, by estimation, is 55 to 60%. The left ventricle has normal function. The left ventricle has no regional wall motion abnormalities. The average left ventricular global longitudinal strain is -21.4 %. The global longitudinal strain is normal. The left ventricular internal cavity size was normal in size. There is no left ventricular hypertrophy. Left ventricular diastolic parameters were normal.  Right Ventricle: The right ventricular size is normal. No increase in right ventricular wall thickness. Right  ventricular systolic function is normal.  Left Atrium: Left atrial size was normal in size.  Right Atrium: Right atrial size was normal in size.  Pericardium: There is no evidence of pericardial effusion.  Mitral Valve: The mitral valve is normal in structure. Trivial mitral valve regurgitation. No evidence of mitral valve stenosis.  Tricuspid Valve: The tricuspid valve is normal in structure. Tricuspid valve regurgitation is trivial. No evidence of tricuspid stenosis.  Aortic Valve: The aortic valve is tricuspid. Aortic valve regurgitation is not visualized. No aortic stenosis is present.  Pulmonic Valve: The pulmonic valve was grossly normal. Pulmonic valve regurgitation is trivial.  Aorta: The aortic root, ascending aorta, aortic arch and descending aorta are all structurally normal, with no evidence of dilitation or obstruction.  Venous: The inferior vena cava is normal in size  with greater than 50% respiratory variability, suggesting right atrial pressure of 3 mmHg.  IAS/Shunts: No atrial level shunt detected by color flow Doppler.   LEFT VENTRICLE PLAX 2D LVIDd:         4.70 cm  Diastology LVIDs:         2.70 cm  LV e' medial:    13.40 cm/s LV PW:         0.60 cm  LV E/e' medial:  6.5 LV IVS:        0.40 cm  LV e' lateral:   16.20 cm/s LVOT diam:     1.90 cm  LV E/e' lateral: 5.4 LV SV:         52 LV SV Index:   30       2D Longitudinal Strain LVOT Area:     2.84 cm 2D Strain GLS (A2C):   -20.5 % 2D Strain GLS (A3C):   -21.3 % 2D Strain GLS (A4C):   -22.4 % 2D Strain GLS Avg:     -21.4 %  3D Volume EF: 3D EF:        58 % LV EDV:       126 ml LV ESV:       53 ml LV SV:        73 ml  RIGHT VENTRICLE RV Basal diam:  3.40 cm RV S prime:     10.90 cm/s TAPSE (M-mode): 1.8 cm  LEFT ATRIUM             Index       RIGHT ATRIUM           Index LA diam:        3.90 cm 2.24 cm/m  RA Area:     10.10 cm LA Vol (A2C):   29.4 ml 16.86 ml/m RA Volume:   21.50 ml  12.33  ml/m LA Vol (A4C):   27.4 ml 15.72 ml/m LA Biplane Vol: 28.6 ml 16.41 ml/m AORTIC VALVE LVOT Vmax:   93.95 cm/s LVOT Vmean:  63.950 cm/s LVOT VTI:    0.185 m  AORTA Ao Root diam: 2.80 cm Ao Asc diam:  2.60 cm  MITRAL VALVE MV Area (PHT): 2.95 cm    SHUNTS MV Decel Time: 257 msec    Systemic VTI:  0.18 m MV E velocity: 86.80 cm/s  Systemic Diam: 1.90 cm MV A velocity: 75.70 cm/s MV E/A ratio:  1.15  Jodelle Red MD Electronically signed by Jodelle Red MD Signature Date/Time: 06/20/2021/4:38:51 PM    Final   TEE  ECHO INTRAOPERATIVE TEE 04/25/2021  Narrative *INTRAOPERATIVE TRANSESOPHAGEAL REPORT *    Patient Name:   LAASIA ARCOS Date of Exam: 04/25/2021 Medical Rec #:  [id number]       Height:       62.0 in Accession #:    [id number]      Weight:       151.9 lb Date of Birth:  [birth year]       BSA:          1.70 m Patient Age:    44 years        BP:           0/0 mmHg Patient Gender: F               HR:           69 bpm. Exam Location:  Inpatient  Transesophogeal exam was perform intraoperatively during surgical procedure. Patient was  closely monitored under general anesthesia during the entirety of examination.  Indications:     coronary artery bypass surgery Performing Phys: [id number] Ephriam Knuckles Z ATKINS Diagnosing Phys: Kipp Brood MD  Complications: No known complications during this procedure. PRE-OP FINDINGS Left Ventricle: The left ventricle has normal systolic function, with an ejection fraction of 55-60%. The cavity size was normal. There is no increase in left ventricular wall thickness. No evidence of left ventricular regional wall motion abnormalities. There is no left ventricular hypertrophy.  On the post-bypass exam, there was akinesis of the basal inferior wall. The remaining LV segments appeared to contract normally. The ejection fraction was 45-50% by visual estimation.   Right Ventricle: The right ventricle has normal  systolic function. The cavity was normal. There is no increase in right ventricular wall thickness.  On the post-bypass exam, the RV systolic function appeared normal and unchanged from the pre-bypass exam.  Left Atrium: Left atrial size was normal in size. No left atrial/left atrial appendage thrombus was detected.  Right Atrium: Right atrial size was normal in size.  Interatrial Septum: No atrial level shunt detected by color flow Doppler.  Pericardium: There is no evidence of pericardial effusion.  Mitral Valve: The mitral valve is normal in structure. Mitral valve regurgitation is trivial by color flow Doppler. There is No evidence of mitral stenosis.  Tricuspid Valve: The tricuspid valve was normal in structure. Tricuspid valve regurgitation is trivial by color flow Doppler. No evidence of tricuspid stenosis is present.  Aortic Valve: The aortic valve is tricuspid Aortic valve regurgitation was not visualized by color flow Doppler. There is no stenosis of the aortic valve.   Pulmonic Valve: The pulmonic valve was not assessed. Pulmonic valve regurgitation was not assessed by color flow Doppler.    +------------+--------++ 3D Volume EF         +------------+--------++ LV 3D EDV:  82.30 ml +------------+--------++ LV 3D ESV:  58.40 ml +------------+--------++   Kipp Brood MD Electronically signed by Kipp Brood MD Signature Date/Time: 04/26/2021/9:05:17 AM    Final            EKG:   EKG Interpretation Date/Time:  Monday September 15 2023 08:50:31 EDT Ventricular Rate:  50 PR Interval:  154 QRS Duration:  102 QT Interval:  458 QTC Calculation: 417 R Axis:   13  Text Interpretation: Sinus bradycardia When compared with ECG of 26-Apr-2021 07:08, Nonspecific T wave abnormality no longer evident in Anterolateral leads QT has shortened Confirmed by Tonny Bollman [phone]) on 09/15/2023 9:05:25 AM    Recent Labs: 01/16/2023: ALT 21; BUN 13; Creatinine,  Ser 0.81; Hemoglobin 15.2; Platelets 357.0; Potassium 4.8; Sodium 136  Recent Lipid Panel    Component Value Date/Time   CHOL 142 01/16/2023 1346   CHOL 155 08/20/2021 0840   TRIG 208.0 (H) 01/16/2023 1346   HDL 39.60 01/16/2023 1346   HDL 35 (L) 08/20/2021 0840   CHOLHDL 4 01/16/2023 1346   VLDL 41.6 (H) 01/16/2023 1346   LDLCALC 91 08/20/2021 0840   LDLDIRECT 80.0 01/16/2023 1346     Risk Assessment/Calculations:                Physical Exam:    VS:  BP 110/70   Pulse (!) 50   Ht 5\' 2"  (1.575 m)   Wt 158 lb 9.6 oz (71.9 kg)   SpO2 99%   BMI 29.01 kg/m     Wt Readings from Last 3 Encounters:  09/15/23 158 lb 9.6 oz (71.9  kg)  01/16/23 157 lb 9.6 oz (71.5 kg)  08/19/22 169 lb (76.7 kg)     GEN:  Well nourished, well developed in no acute distress HEENT: Normal NECK: No JVD; No carotid bruits LYMPHATICS: No lymphadenopathy CARDIAC: RRR, no murmurs, rubs, gallops RESPIRATORY:  Clear to auscultation without rales, wheezing or rhonchi  ABDOMEN: Soft, non-tender, non-distended MUSCULOSKELETAL:  No edema; No deformity  SKIN: Warm and dry NEUROLOGIC:  Alert and oriented x 3 PSYCHIATRIC:  Normal affect   Assessment & Plan S/P CABG x 1 The patient is doing well with no angina.  She will continue her current medical program which includes aspirin, high intensity statin drug, and metoprolol.  I will see her back in 1 year for follow-up evaluation.  She is exercising 3 days/week with no exertional symptoms. Fibromuscular dysplasia (HCC) I reviewed her renal ultrasound from 2022 which had mildly elevated velocities and some suggestion of fibromuscular dysplasia although nothing diagnostic.  Carotid ultrasound also showed minimal beading with no elevated velocities.  Continue aspirin and a beta-blocker. Mixed hyperlipidemia Treated with atorvastatin 40 mg daily.  Cholesterol is 142, LDL is 91.  No atherosclerotic disease noted.      Medication Adjustments/Labs and Tests  Ordered: Current medicines are reviewed at length with the patient today.  Concerns regarding medicines are outlined above.  Orders Placed This Encounter  Procedures   EKG 12-Lead   Meds ordered this encounter  Medications   metoprolol tartrate (LOPRESSOR) 25 MG tablet    Sig: Take 0.5 tablets (12.5 mg total) by mouth in the morning and at bedtime.    Dispense:  90 tablet    Refill:  3    Patient Instructions  Medication Instructions:  Your physician recommends that you continue on your current medications as directed. Please refer to the Current Medication list given to you today.  Follow-Up: At [facility], you and your health needs are our priority.  As part of our continuing mission to provide you with exceptional heart care, we have created designated Provider Care Teams.  These Care Teams include your primary Cardiologist (physician) and Advanced Practice Providers (APPs -  Physician Assistants and Nurse Practitioners) who all work together to provide you with the care you need, when you need it.  Your next appointment:   1 year(s)  Provider:   Tonny Bollman, MD        Signed, Tonny Bollman, MD  09/15/2023 9:16 AM    [facility]

## 2023-09-15 ENCOUNTER — Encounter: Payer: Self-pay | Admitting: Cardiovascular Disease

## 2023-09-15 ENCOUNTER — Other Ambulatory Visit (HOSPITAL_BASED_OUTPATIENT_CLINIC_OR_DEPARTMENT_OTHER): Payer: Self-pay

## 2023-09-15 ENCOUNTER — Ambulatory Visit: Payer: 59 | Attending: Cardiovascular Disease | Admitting: Cardiovascular Disease

## 2023-09-15 VITALS — BP 110/70 | HR 50 | Ht 62.0 in | Wt 158.6 lb

## 2023-09-15 DIAGNOSIS — Z951 Presence of aortocoronary bypass graft: Secondary | ICD-10-CM | POA: Diagnosis not present

## 2023-09-15 DIAGNOSIS — I773 Arterial fibromuscular dysplasia: Secondary | ICD-10-CM

## 2023-09-15 DIAGNOSIS — E782 Mixed hyperlipidemia: Secondary | ICD-10-CM

## 2023-09-15 MED ORDER — METOPROLOL TARTRATE 25 MG PO TABS
12.5000 mg | ORAL_TABLET | Freq: Two times a day (BID) | ORAL | 3 refills | Status: DC
  Filled 2023-09-15 – 2023-10-09 (×2): qty 90, 90d supply, fill #0
  Filled 2024-01-07: qty 90, 90d supply, fill #1
  Filled 2024-04-08: qty 90, 90d supply, fill #2
  Filled 2024-07-02: qty 90, 90d supply, fill #3

## 2023-09-15 NOTE — Patient Instructions (Signed)
Medication Instructions:  Your physician recommends that you continue on your current medications as directed. Please refer to the Current Medication list given to you today.  Follow-Up: At [facility], you and your health needs are our priority.  As part of our continuing mission to provide you with exceptional heart care, we have created designated Provider Care Teams.  These Care Teams include your primary Cardiologist (physician) and Advanced Practice Providers (APPs -  Physician Assistants and Nurse Practitioners) who all work together to provide you with the care you need, when you need it.  Your next appointment:   1 year(s)  Provider:   Tonny Bollman, MD

## 2023-09-15 NOTE — Assessment & Plan Note (Signed)
The patient is doing well with no angina.  She will continue her current medical program which includes aspirin, high intensity statin drug, and metoprolol.  I will see her back in 1 year for follow-up evaluation.  She is exercising 3 days/week with no exertional symptoms.

## 2023-09-15 NOTE — Assessment & Plan Note (Signed)
Treated with atorvastatin 40 mg daily.  Cholesterol is 142, LDL is 91.  No atherosclerotic disease noted.

## 2023-09-16 ENCOUNTER — Other Ambulatory Visit (HOSPITAL_BASED_OUTPATIENT_CLINIC_OR_DEPARTMENT_OTHER): Payer: Self-pay

## 2023-10-09 ENCOUNTER — Other Ambulatory Visit: Payer: Self-pay | Admitting: Cardiovascular Disease

## 2023-10-09 ENCOUNTER — Other Ambulatory Visit (HOSPITAL_BASED_OUTPATIENT_CLINIC_OR_DEPARTMENT_OTHER): Payer: Self-pay

## 2023-10-09 MED ORDER — ATORVASTATIN CALCIUM 40 MG PO TABS
40.0000 mg | ORAL_TABLET | Freq: Every day | ORAL | 11 refills | Status: AC
  Filled 2023-10-09: qty 30, 30d supply, fill #0
  Filled 2023-11-13: qty 30, 30d supply, fill #1
  Filled 2023-12-15: qty 30, 30d supply, fill #2
  Filled 2024-01-12: qty 30, 30d supply, fill #3
  Filled 2024-02-13: qty 30, 30d supply, fill #4
  Filled 2024-03-16: qty 30, 30d supply, fill #5
  Filled 2024-04-17: qty 30, 30d supply, fill #6
  Filled 2024-05-17: qty 30, 30d supply, fill #7

## 2023-12-02 ENCOUNTER — Other Ambulatory Visit (HOSPITAL_BASED_OUTPATIENT_CLINIC_OR_DEPARTMENT_OTHER): Payer: Self-pay

## 2023-12-12 ENCOUNTER — Encounter: Payer: Self-pay | Admitting: Pharmacist

## 2023-12-12 ENCOUNTER — Encounter: Payer: Self-pay | Admitting: Cardiovascular Disease

## 2023-12-12 DIAGNOSIS — N63 Unspecified lump in unspecified breast: Secondary | ICD-10-CM | POA: Insufficient documentation

## 2023-12-15 ENCOUNTER — Other Ambulatory Visit (HOSPITAL_BASED_OUTPATIENT_CLINIC_OR_DEPARTMENT_OTHER): Payer: Self-pay

## 2024-01-12 ENCOUNTER — Other Ambulatory Visit (HOSPITAL_BASED_OUTPATIENT_CLINIC_OR_DEPARTMENT_OTHER): Payer: Self-pay

## 2024-01-12 ENCOUNTER — Other Ambulatory Visit (HOSPITAL_COMMUNITY)
Admission: RE | Admit: 2024-01-12 | Discharge: 2024-01-12 | Disposition: A | Discharge status: Home / Self Care | Payer: 59 | Source: Ambulatory Visit | Attending: Family Medicine | Admitting: Family Medicine

## 2024-01-12 ENCOUNTER — Encounter: Payer: Self-pay | Admitting: Family Medicine

## 2024-01-12 ENCOUNTER — Ambulatory Visit: Payer: 59 | Admitting: Family Medicine

## 2024-01-12 VITALS — BP 131/61 | HR 54 | Wt 161.0 lb

## 2024-01-12 DIAGNOSIS — Z01419 Encounter for gynecological examination (general) (routine) without abnormal findings: Secondary | ICD-10-CM | POA: Insufficient documentation

## 2024-01-12 DIAGNOSIS — Z1339 Encounter for screening examination for other mental health and behavioral disorders: Secondary | ICD-10-CM | POA: Diagnosis not present

## 2024-01-12 NOTE — Progress Notes (Signed)
 ANNUAL EXAM Patient name: Anna Vang MRN [id number]  Date of birth: 01-03-1976 Chief Complaint:   Annual Exam  History of Present Illness:   Anna Vang is a 48 y.o. G83P2002 Caucasian female being seen today for a routine annual exam.  Current complaints: No current complaints  Patient's last menstrual period was 12/18/2023 (exact date).   The pregnancy intention screening data noted above was reviewed. Potential methods of contraception were discussed. The patient elected to proceed with No data recorded.   Last pap patient reports that was approximately 2 years ago but unable to see records. Results were:  Reported normal . H/O abnormal pap: no Last mammogram: Approximately 2 years ago. Results were: normal.  Last colonoscopy: Never done     01/12/2024    9:20 AM 01/16/2023    2:25 PM 01/13/2017   10:48 AM  Depression screen PHQ 2/9  Decreased Interest 0 0 0  Down, Depressed, Hopeless 3 1 0  PHQ - 2 Score 3 1 0  Altered sleeping 2 1   Tired, decreased energy 0 0   Change in appetite 0 0   Feeling bad or failure about yourself  0 1   Trouble concentrating 0 0   Moving slowly or fidgety/restless 0 0   Suicidal thoughts 0 0   PHQ-9 Score 5 3   Difficult doing work/chores  Somewhat difficult         01/12/2024    9:21 AM 01/16/2023    2:26 PM  GAD 7 : Generalized Anxiety Score  Nervous, Anxious, on Edge 1 0  Control/stop worrying 0 0  Worry too much - different things 0 0  Trouble relaxing 1 0  Restless 2 1  Easily annoyed or irritable 2 1  Afraid - awful might happen 2 0  Total GAD 7 Score 8 2  Anxiety Difficulty  Not difficult at all     Review of Systems:   Pertinent items are noted in HPI Denies any headaches, blurred vision, fatigue, shortness of breath, chest pain, abdominal pain, abnormal vaginal discharge/itching/odor/irritation, problems with periods, bowel movements, urination, or intercourse unless otherwise stated above. Pertinent History  Reviewed:  Reviewed past medical,surgical, social and family history.  Reviewed problem list, medications and allergies. Physical Assessment:   Vitals:   01/12/24 0851  BP: 131/61  Pulse: (!) 54  Weight: 161 lb (73 kg)  Body mass index is 29.45 kg/m.        Physical Examination:   General appearance - well appearing, and in no distress  Mental status - alert, oriented to person, place, and time  Psych:  She has a normal mood and affect  Skin - warm and dry, normal color, no suspicious lesions noted  Chest - effort normal, all lung fields clear to auscultation bilaterally  Heart - normal rate and regular rhythm  Neck:  midline trachea, no thyromegaly or nodules  Breasts - breasts appear normal, no suspicious masses, no skin or nipple changes or  axillary nodes  Abdomen - soft, nontender, nondistended, no masses or organomegaly  Pelvic - VULVA: normal appearing vulva with no masses, tenderness or lesions  VAGINA: normal appearing vagina with normal color and discharge, no lesions  CERVIX: normal appearing cervix without discharge or lesions, no CMT  Thin prep pap is done with HR HPV cotesting  UTERUS: uterus is felt to be normal size, shape, consistency and nontender   ADNEXA: No adnexal masses or tenderness noted.  Extremities:  No swelling  or varicosities noted  Chaperone present for exam  No results found for this or any previous visit (from the past 24 hours).  Assessment & Plan:  1) Well-Woman Exam Pap smear collected today Colonoscopy ordered Mammogram ordered   Labs/procedures today: Pap smear  Mammogram: schedule screening mammo as soon as possible, or sooner if problems Colonoscopy: schedule screening colonoscopy as soon as possible, or sooner if problems  Orders Placed This Encounter  Procedures   MM 3D SCREENING MAMMOGRAM BILATERAL BREAST   Ambulatory referral to Gastroenterology    Meds: No orders of the defined types were placed in this  encounter.   Follow-up: No follow-ups on file.  Ranae Casebier V Hanae Waiters, MD 01/12/2024 10:25 AM

## 2024-01-13 ENCOUNTER — Telehealth (HOSPITAL_BASED_OUTPATIENT_CLINIC_OR_DEPARTMENT_OTHER): Payer: Self-pay

## 2024-01-13 DIAGNOSIS — Z01419 Encounter for gynecological examination (general) (routine) without abnormal findings: Secondary | ICD-10-CM | POA: Diagnosis present

## 2024-01-16 LAB — CYTOLOGY - PAP
Comment: NEGATIVE
Diagnosis: NEGATIVE
High risk HPV: NEGATIVE

## 2024-02-13 ENCOUNTER — Other Ambulatory Visit (HOSPITAL_BASED_OUTPATIENT_CLINIC_OR_DEPARTMENT_OTHER): Payer: Self-pay

## 2024-04-08 ENCOUNTER — Other Ambulatory Visit (HOSPITAL_COMMUNITY): Payer: Self-pay

## 2024-04-08 ENCOUNTER — Other Ambulatory Visit (HOSPITAL_BASED_OUTPATIENT_CLINIC_OR_DEPARTMENT_OTHER): Payer: Self-pay

## 2024-05-17 ENCOUNTER — Other Ambulatory Visit (HOSPITAL_BASED_OUTPATIENT_CLINIC_OR_DEPARTMENT_OTHER): Payer: Self-pay

## 2024-05-31 ENCOUNTER — Other Ambulatory Visit (HOSPITAL_BASED_OUTPATIENT_CLINIC_OR_DEPARTMENT_OTHER): Payer: Self-pay

## 2024-05-31 MED ORDER — LIDOCAINE VISCOUS HCL 2 % MT SOLN
OROMUCOSAL | 0 refills | Status: DC
  Filled 2024-05-31: qty 100, 7d supply, fill #0

## 2024-05-31 MED ORDER — FLUTICASONE PROPIONATE 50 MCG/ACT NA SUSP
1.0000 | Freq: Two times a day (BID) | NASAL | 0 refills | Status: AC | PRN
  Filled 2024-05-31: qty 16, 30d supply, fill #0

## 2024-06-03 ENCOUNTER — Ambulatory Visit: Admitting: Family Medicine

## 2024-06-03 VITALS — BP 134/71 | HR 62 | Wt 151.0 lb

## 2024-06-03 DIAGNOSIS — Z951 Presence of aortocoronary bypass graft: Secondary | ICD-10-CM

## 2024-06-03 DIAGNOSIS — E782 Mixed hyperlipidemia: Secondary | ICD-10-CM | POA: Diagnosis not present

## 2024-06-03 DIAGNOSIS — Z98891 History of uterine scar from previous surgery: Secondary | ICD-10-CM

## 2024-06-03 DIAGNOSIS — I2111 ST elevation (STEMI) myocardial infarction involving right coronary artery: Secondary | ICD-10-CM | POA: Diagnosis not present

## 2024-06-03 DIAGNOSIS — I2542 Coronary artery dissection: Secondary | ICD-10-CM | POA: Diagnosis not present

## 2024-06-03 NOTE — Progress Notes (Signed)
   Subjective:    Patient ID: Anna Vang, female    DOB: November 27, 1976, 48 y.o.   MRN: [id number]  HPI  Patient seen for prepregnancy counseling.  She is a G2 P2-0-0-2.  She has a history of a C-section due to breech position, followed by successful VBAC.  These pregnancies were in 2004 and 2010.  Her pregnancies were uncomplicated.  The birth weights of her children were approximately 5-1/2 and 6 pounds respectively.  Medically, she has a history of a STEMI in 04/2021.  Her STEMI was secondary to a spontaneous coronary artery dissection, found on heart cath.  She was taken for an emergency CABG x 1 vessel.  A follow-up echo on 06/2021 showed normal EF with no wall motion abnormality.  She does have dyslipidemia and is on atorvastatin , as well as metoprolol .  She is currently just taking a baby aspirin  for antiplatelet therapy.  She recently married after her previous husband passed away 2 years ago.  He does not have any children and she is open to the idea of getting pregnant and is here for prepregnancy counseling.  Review of Systems     Objective:   Physical Exam Vitals reviewed.  Constitutional:      Appearance: Normal appearance.  Skin:    Capillary Refill: Capillary refill takes less than 2 seconds.  Neurological:     General: No focal deficit present.     Mental Status: She is alert.  Psychiatric:        Mood and Affect: Mood normal.        Behavior: Behavior normal.        Thought Content: Thought content normal.        Judgment: Judgment normal.       Assessment & Plan:  1. Spontaneous dissection of coronary artery (Primary) As the scad occurred at site of pregnancy, the likelihood of developing scad during pregnancy is slightly elevated but not as high as if the scad had happened during pregnancy.  Will refer patient to MFM and cardio OB for prepregnancy consultations.  From a functional standpoint, she is active, does some cardiovascular activities as well as some  weight training.  She experiences no chest pain, problems breathing, etc.  Of her medications, the only 1 that she should stop either when pregnancy occurs or if actively trying to become pregnant would be atorvastatin .  I would recommend keeping on the baby aspirin  and metoprolol  for the time being.  We did discuss the possibility of genetic screening - she does have an increased Down's Syndrome risk of about 1 in 30.  - AMB Referral to Cardio Obstetrics - AMB referral to maternal fetal medicine  2. STEMI involving right coronary artery (HCC) In the setting of SCAD - AMB Referral to Cardio Obstetrics - AMB referral to maternal fetal medicine  3. S/P CABG x 1 - AMB Referral to Cardio Obstetrics - AMB referral to maternal fetal medicine  4. Mixed hyperlipidemia  5. History of cesarean section Although the patient would like to have a VBAC, in the end she would be comfortable with any mode of delivery recommended by us  for safety.  Even though her C-section was many years ago and had a successful VBAC, she still has approximately 1% uterine rupture rate.  A total of 47 minutes were spent counseling the patient and reviewing the patient's chart.

## 2024-06-04 ENCOUNTER — Telehealth: Admitting: Family Medicine

## 2024-06-04 DIAGNOSIS — J019 Acute sinusitis, unspecified: Secondary | ICD-10-CM | POA: Diagnosis not present

## 2024-06-04 MED ORDER — AMOXICILLIN-POT CLAVULANATE 875-125 MG PO TABS
1.0000 | ORAL_TABLET | Freq: Two times a day (BID) | ORAL | 0 refills | Status: AC
  Filled 2024-06-04: qty 14, 7d supply, fill #0

## 2024-06-04 NOTE — Patient Instructions (Signed)
 Kiya M Goller, thank you for joining [name], PA-C for today's virtual visit.  While this provider is not your primary care provider (PCP), if your PCP is located in our provider database this encounter information will be shared with them immediately following your visit.   A [facility] MyChart account gives you access to today's visit and all your visits, tests, and labs performed at [facility]  click here if you don't have a [facility] MyChart account or go to [email or web address]  Consent: (Patient) Anna Vang provided verbal consent for this virtual visit at the beginning of the encounter.  Current Medications:  Current Outpatient Medications:    amoxicillin -clavulanate (AUGMENTIN ) 875-125 MG tablet, Take 1 tablet by mouth 2 (two) times daily for 7 days., Disp: 14 tablet, Rfl: 0   Ascorbic Acid (VITAMIN C) 500 MG CAPS, Take by mouth., Disp: , Rfl:    aspirin  EC 81 MG EC tablet, Take 1 tablet (81 mg total) by mouth daily. Swallow whole., Disp: 30 tablet, Rfl: 11   atorvastatin  (LIPITOR) 40 MG tablet, Take 1 tablet (40 mg total) by mouth at bedtime., Disp: 30 tablet, Rfl: 11   Calcium  Carb-Cholecalciferol (CALCIUM  600 + D PO), Take by mouth., Disp: , Rfl:    cetirizine (ZYRTEC) 10 MG tablet, Take 10 mg by mouth daily as needed for allergies., Disp: , Rfl:    cyanocobalamin (VITAMIN B12) 1000 MCG tablet, Take 1,000 mcg by mouth daily., Disp: , Rfl:    fluticasone  (FLONASE ) 50 MCG/ACT nasal spray, Place 1 spray into both nostrils every 12 (twelve) hours as needed., Disp: 16 g, Rfl: 0   folic acid  (FOLVITE ) 800 MCG tablet, Take 800 mcg by mouth daily., Disp: , Rfl:    magnesium  oxide (MAG-OX) 400 (240 Mg) MG tablet, Take 400 mg by mouth daily., Disp: , Rfl:    metoprolol  tartrate (LOPRESSOR ) 25 MG tablet, Take 0.5 tablets (12.5 mg total) by mouth in the morning and at bedtime., Disp: 90 tablet, Rfl: 3   Nutritional Supplements (JUICE PLUS FIBRE PO), Take by  mouth., Disp: , Rfl:    VITAMIN D  PO, Take 1 tablet by mouth daily., Disp: , Rfl:    Medications ordered in this encounter:  Meds ordered this encounter  Medications   amoxicillin -clavulanate (AUGMENTIN ) 875-125 MG tablet    Sig: Take 1 tablet by mouth 2 (two) times daily for 7 days.    Dispense:  14 tablet    Refill:  0     *If you need refills on other medications prior to your next appointment, please contact your pharmacy*  Follow-Up: Call back or seek an in-person evaluation if the symptoms worsen or if the condition fails to improve as anticipated.  [facility] [phone]  Other Instructions Sinus Infection, Adult A sinus infection, also called sinusitis, is inflammation of your sinuses. Sinuses are hollow spaces in the bones around your face. Your sinuses are located: Around your eyes. In the middle of your forehead. Behind your nose. In your cheekbones. Mucus normally drains out of your sinuses. When your nasal tissues become inflamed or swollen, mucus can become trapped or blocked. This allows bacteria, viruses, and fungi to grow, which leads to infection. Most infections of the sinuses are caused by a virus. A sinus infection can develop quickly. It can last for up to 4 weeks (acute) or for more than 12 weeks (chronic). A sinus infection often develops after a cold. What are the causes? This condition is  caused by anything that creates swelling in the sinuses or stops mucus from draining. This includes: Allergies. Asthma. Infection from bacteria or viruses. Deformities or blockages in your nose or sinuses. Abnormal growths in the nose (nasal polyps). Pollutants, such as chemicals or irritants in the air. Infection from fungi. This is rare. What increases the risk? You are more likely to develop this condition if you: Have a weak body defense system (immune system). Do a lot of swimming or diving. Overuse nasal sprays. Smoke. What are the signs or  symptoms? The main symptoms of this condition are pain and a feeling of pressure around the affected sinuses. Other symptoms include: Stuffy nose or congestion that makes it difficult to breathe through your nose. Thick yellow or greenish drainage from your nose. Tenderness, swelling, and warmth over the affected sinuses. A cough that may get worse at night. Decreased sense of smell and taste. Extra mucus that collects in the throat or the back of the nose (postnasal drip) causing a sore throat or bad breath. Tiredness (fatigue). Fever. How is this diagnosed? This condition is diagnosed based on: Your symptoms. Your medical history. A physical exam. Tests to find out if your condition is acute or chronic. This may include: Checking your nose for nasal polyps. Viewing your sinuses using a device that has a light (endoscope). Testing for allergies or bacteria. Imaging tests, such as an MRI or CT scan. In rare cases, a bone biopsy may be done to rule out more serious types of fungal sinus disease. How is this treated? Treatment for a sinus infection depends on the cause and whether your condition is chronic or acute. If caused by a virus, your symptoms should go away on their own within 10 days. You may be given medicines to relieve symptoms. They include: Medicines that shrink swollen nasal passages (decongestants). A spray that eases inflammation of the nostrils (topical intranasal corticosteroids). Rinses that help get rid of thick mucus in your nose (nasal saline washes). Medicines that treat allergies (antihistamines). Over-the-counter pain relievers. If caused by bacteria, your health care provider may recommend waiting to see if your symptoms improve. Most bacterial infections will get better without antibiotic medicine. You may be given antibiotics if you have: A severe infection. A weak immune system. If caused by narrow nasal passages or nasal polyps, surgery may be  needed. Follow these instructions at home: Medicines Take, use, or apply over-the-counter and prescription medicines only as told by your health care provider. These may include nasal sprays. If you were prescribed an antibiotic medicine, take it as told by your health care provider. Do not stop taking the antibiotic even if you start to feel better. Hydrate and humidify  Drink enough fluid to keep your urine pale yellow. Staying hydrated will help to thin your mucus. Use a cool mist humidifier to keep the humidity level in your home above 50%. Inhale steam for 10-15 minutes, 3-4 times a day, or as told by your health care provider. You can do this in the bathroom while a hot shower is running. Limit your exposure to cool or dry air. Rest Rest as much as possible. Sleep with your head raised (elevated). Make sure you get enough sleep each night. General instructions  Apply a warm, moist washcloth to your face 3-4 times a day or as told by your health care provider. This will help with discomfort. Use nasal saline washes as often as told by your health care provider. Wash your hands often  with soap and water to reduce your exposure to germs. If soap and water are not available, use hand sanitizer. Do not smoke. Avoid being around people who are smoking (secondhand smoke). Keep all follow-up visits. This is important. Contact a health care provider if: You have a fever. Your symptoms get worse. Your symptoms do not improve within 10 days. Get help right away if: You have a severe headache. You have persistent vomiting. You have severe pain or swelling around your face or eyes. You have vision problems. You develop confusion. Your neck is stiff. You have trouble breathing. These symptoms may be an emergency. Get help right away. Call 911. Do not wait to see if the symptoms will go away. Do not drive yourself to the hospital. Summary A sinus infection is soreness and inflammation of  your sinuses. Sinuses are hollow spaces in the bones around your face. This condition is caused by nasal tissues that become inflamed or swollen. The swelling traps or blocks the flow of mucus. This allows bacteria, viruses, and fungi to grow, which leads to infection. If you were prescribed an antibiotic medicine, take it as told by your health care provider. Do not stop taking the antibiotic even if you start to feel better. Keep all follow-up visits. This is important. This information is not intended to replace advice given to you by your health care provider. Make sure you discuss any questions you have with your health care provider. Document Revised: 10/23/2021 Document Reviewed: 10/23/2021 Elsevier Patient Education  2024 [employer]   If you have been instructed to have an in-person evaluation today at a local Urgent Care facility, please use the link below. It will take you to a list of all of our available [facility], including address, phone number and hours of operation. Please do not delay care.  [facility]  If you or a family member do not have a primary care provider, use the link below to schedule a visit and establish care. When you choose a [facility] primary care physician or advanced practice provider, you gain a long-term partner in health. Find a Primary Care Provider  Learn more about [facility]'s in-office and virtual care options: [facility] - Get Care Now

## 2024-06-04 NOTE — Progress Notes (Signed)
 Virtual Visit Consent   Anna Vang, you are scheduled for a virtual visit with a [facility] provider today. Just as with appointments in the office, your consent must be obtained to participate. Your consent will be active for this visit and any virtual visit you may have with one of our providers in the next 365 days. If you have a MyChart account, a copy of this consent can be sent to you electronically.  As this is a virtual visit, video technology does not allow for your provider to perform a traditional examination. This may limit your provider's ability to fully assess your condition. If your provider identifies any concerns that need to be evaluated in person or the need to arrange testing (such as labs, EKG, etc.), we will make arrangements to do so. Although advances in technology are sophisticated, we cannot ensure that it will always work on either your end or our end. If the connection with a video visit is poor, the visit may have to be switched to a telephone visit. With either a video or telephone visit, we are not always able to ensure that we have a secure connection.  By engaging in this virtual visit, you consent to the provision of healthcare and authorize for your insurance to be billed (if applicable) for the services provided during this visit. Depending on your insurance coverage, you may receive a charge related to this service.  I need to obtain your verbal consent now. Are you willing to proceed with your visit today? Anna Vang has provided verbal consent on 06/04/2024 for a virtual visit (video or telephone). [name], [place]  Date: 06/04/2024 11:35 AM   Virtual Visit via Video Note   I, [name], connected with  Anna Vang  ([id number], 01/11/1976) on 06/04/24 at 11:30 AM EDT by a video-enabled telemedicine application and verified that I am speaking with the correct person using two identifiers.  Location: Patient: Virtual Visit Location Patient:  Home Provider: Virtual Visit Location Provider: Home Office   I discussed the limitations of evaluation and management by telemedicine and the availability of in person appointments. The patient expressed understanding and agreed to proceed.    History of Present Illness: Anna Vang is a 47 y.o. who identifies as a female who was assigned female at birth, and is being seen today for c/o being sick for 3 weeks.  Pt states she thinks its bronchitis and lost her voice for three days.  Pt states she has a deep cough and feels like symptoms have progressed into her sinuses.  Pt states using flonase  and throat numbing spray, mucinex and an inhaler.  Pt states she has facial pressure and thick yellow mucus when she coughs and blows her nose.  Pt denies fever or chills.   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 Swallow whole., Disp: 30 tablet, Rfl: 11   atorvastatin  (LIPITOR) 40 MG tablet, Take 1 tablet (40 mg total) by mouth at bedtime., Disp: 30 tablet, Rfl: 11   Calcium  Carb-Cholecalciferol (CALCIUM  600 + D PO), Take by mouth., Disp: , Rfl:    cetirizine (ZYRTEC) 10 MG tablet, Take 10 mg by mouth daily as needed for allergies., Disp: , Rfl:    cyanocobalamin (VITAMIN B12) 1000 MCG  tablet, Take 1,000 mcg by mouth daily., Disp: , Rfl:    fluticasone  (FLONASE ) 50 MCG/ACT nasal spray, Place 1 spray into both nostrils every 12 (twelve) hours as needed., Disp: 16 g, Rfl: 0   folic acid  (FOLVITE ) 800 MCG tablet, Take 800 mcg by mouth daily., Disp: , Rfl:    magnesium  oxide (MAG-OX) 400 (240 Mg) MG tablet, Take 400 mg by mouth daily., Disp: , Rfl:    metoprolol  tartrate (LOPRESSOR ) 25 MG tablet, Take 0.5 tablets (12.5 mg total) by mouth in the morning and at bedtime., Disp: 90 tablet, Rfl: 3   Nutritional Supplements (JUICE PLUS FIBRE PO), Take by mouth., Disp: , Rfl:    VITAMIN D  PO, Take 1 tablet by mouth daily., Disp: , Rfl:   Observations/Objective: Patient is well-developed, well-nourished in no acute distress.  Resting comfortably at home.  Head is normocephalic, atraumatic.  No labored breathing.  Speech is clear and coherent with logical content.  Patient is alert and oriented at baseline.    Assessment and Plan: 1. Acute non-recurrent sinusitis, unspecified location (Primary) - amoxicillin -clavulanate (AUGMENTIN ) 875-125 MG tablet; Take 1 tablet by mouth 2 (two) times daily for 7 days.  Dispense: 14 tablet; Refill: 0  -Start Augmentin  -Continue flonase  and Mucinex -Pt to F/u with PCP or urgent care for worsening symptoms.   Follow Up Instructions: I discussed the assessment and treatment plan with the patient. The patient was provided an opportunity to ask questions and all were answered. The patient agreed with the plan and demonstrated an understanding of the instructions.  A copy of instructions were sent to the patient via MyChart unless otherwise noted below.    The patient was advised to call back or seek an in-person evaluation if the symptoms worsen or if the condition fails to improve as anticipated.    [name], PA-C

## 2024-06-07 ENCOUNTER — Other Ambulatory Visit (HOSPITAL_BASED_OUTPATIENT_CLINIC_OR_DEPARTMENT_OTHER): Payer: Self-pay

## 2024-06-11 ENCOUNTER — Ambulatory Visit

## 2024-06-22 ENCOUNTER — Other Ambulatory Visit (HOSPITAL_BASED_OUTPATIENT_CLINIC_OR_DEPARTMENT_OTHER): Payer: Self-pay

## 2024-06-22 MED ORDER — LEVOCETIRIZINE DIHYDROCHLORIDE 5 MG PO TABS
5.0000 mg | ORAL_TABLET | Freq: Every evening | ORAL | 0 refills | Status: AC
  Filled 2024-06-22: qty 14, 14d supply, fill #0

## 2024-08-11 ENCOUNTER — Encounter: Payer: Self-pay | Admitting: *Deleted

## 2024-08-13 ENCOUNTER — Ambulatory Visit: Attending: Cardiology | Admitting: Cardiology

## 2024-08-13 ENCOUNTER — Encounter: Payer: Self-pay | Admitting: Cardiology

## 2024-08-13 VITALS — BP 132/84 | HR 55 | Ht 62.0 in | Wt 153.7 lb

## 2024-08-13 DIAGNOSIS — I2510 Atherosclerotic heart disease of native coronary artery without angina pectoris: Secondary | ICD-10-CM

## 2024-08-13 DIAGNOSIS — E782 Mixed hyperlipidemia: Secondary | ICD-10-CM

## 2024-08-13 DIAGNOSIS — Z7689 Persons encountering health services in other specified circumstances: Secondary | ICD-10-CM | POA: Diagnosis not present

## 2024-08-13 DIAGNOSIS — Z3169 Encounter for other general counseling and advice on procreation: Secondary | ICD-10-CM

## 2024-08-13 DIAGNOSIS — I2542 Coronary artery dissection: Secondary | ICD-10-CM

## 2024-08-13 NOTE — Progress Notes (Unsigned)
 Cardiology Office Note:    Date:  08/13/2024   ID:  Anna Vang, DOB 1976/02/18, MRN [id number]  PCP:  Almarie Waddell NOVAK, NP  Cardiologist:  Ozell Fell, MD  Electrophysiologist:  None   Referring MD: Almarie Waddell NOVAK, NP   No chief complaint on file. ***  History of Present Illness:    Anna Vang is a 48 y.o. female with a hx of ***  Past Medical History:  Diagnosis Date   Anxiety     Past Surgical History:  Procedure Laterality Date   CESAREAN SECTION     CORONARY ANGIOGRAPHY N/A 04/25/2021   Procedure: CORONARY ANGIOGRAPHY;  Surgeon: Fell Ozell, MD;  Location: [facility] INVASIVE CV LAB;  Service: Cardiovascular;  Laterality: N/A;   CORONARY ARTERY BYPASS GRAFT N/A 04/25/2021   Procedure: CORONARY ARTERY BYPASS GRAFTING (CABG) times one using right greater saphenous vein harvested endoscopically;  Surgeon: German Bartlett PEDLAR, MD;  Location: [facility] OR;  Service: Open Heart Surgery;  Laterality: N/A;   CORONARY/GRAFT ACUTE MI REVASCULARIZATION N/A 04/25/2021   Procedure: Coronary/Graft Acute MI Revascularization;  Surgeon: Fell Ozell, MD;  Location: [facility] INVASIVE CV LAB;  Service: Cardiovascular;  Laterality: N/A;   ENDOVEIN HARVEST OF GREATER SAPHENOUS VEIN Right 04/25/2021   Procedure: ENDOVEIN HARVEST OF GREATER SAPHENOUS VEIN;  Surgeon: German Bartlett PEDLAR, MD;  Location: [facility] OR;  Service: Open Heart Surgery;  Laterality: Right;   LEFT HEART CATH AND CORONARY ANGIOGRAPHY N/A 04/25/2021   Procedure: LEFT HEART CATH AND CORONARY ANGIOGRAPHY;  Surgeon: Fell Ozell, MD;  Location: [facility] INVASIVE CV LAB;  Service: Cardiovascular;  Laterality: N/A;   TEE WITHOUT CARDIOVERSION N/A 04/25/2021   Procedure: TRANSESOPHAGEAL ECHOCARDIOGRAM (TEE);  Surgeon: German Bartlett PEDLAR, MD;  Location: [facility] OR;  Service: Open Heart Surgery;  Laterality: N/A;    Current Medications: Current Meds  Medication Sig   Ascorbic Acid (VITAMIN C) 500 MG CAPS Take by mouth.   aspirin  EC 81 MG EC tablet Take 1  tablet (81 mg total) by mouth daily. Swallow whole.   Calcium  Carb-Cholecalciferol (CALCIUM  600 + D PO) Take by mouth.   cyanocobalamin (VITAMIN B12) 1000 MCG tablet Take 1,000 mcg by mouth daily.   fluticasone  (FLONASE ) 50 MCG/ACT nasal spray Place 1 spray into both nostrils every 12 (twelve) hours as needed.   folic acid  (FOLVITE ) 800 MCG tablet Take 800 mcg by mouth daily.   levocetirizine (XYZAL ) 5 MG tablet Take 1 tablet (5 mg total) by mouth every evening.   magnesium  oxide (MAG-OX) 400 (240 Mg) MG tablet Take 400 mg by mouth daily.   metoprolol  tartrate (LOPRESSOR ) 25 MG tablet Take 0.5 tablets (12.5 mg total) by mouth in the morning and at bedtime.   Nutritional Supplements (JUICE PLUS FIBRE PO) Take by mouth.   VITAMIN D  PO Take 1 tablet by mouth daily.     Allergies:   Patient has no known allergies.   Social History   Socioeconomic History   Marital status: Widowed    Spouse name: Not on file   Number of children: Not on file   Years of education: Not on file   Highest education level: Not on file  Occupational History   Not on file  Tobacco Use   Smoking status: Never   Smokeless tobacco: Never  Substance and Sexual Activity   Alcohol use: Yes    Alcohol/week: 2.0 standard drinks of alcohol    Types: 2 Glasses of wine per week   Drug use: No  Sexual activity: Yes    Birth control/protection: None  Other Topics Concern   Not on file  Social History Narrative   Not on file   Social Drivers of Health   Financial Resource Strain: Not on file  Food Insecurity: Not on file  Transportation Needs: Not on file  Physical Activity: Not on file  Stress: Not on file  Social Connections: Not on file     Family History: The patient's family history includes Diabetes in her father; Hypertension in her brother and father; Kidney disease in her mother; Mental illness in her mother.  ROS:   Review of Systems  Constitution: Negative for decreased appetite, fever and  weight gain.  HENT: Negative for congestion, ear discharge, hoarse voice and sore throat.   Eyes: Negative for discharge, redness, vision loss in right eye and visual halos.  Cardiovascular: Negative for chest pain, dyspnea on exertion, leg swelling, orthopnea and palpitations.  Respiratory: Negative for cough, hemoptysis, shortness of breath and snoring.   Endocrine: Negative for heat intolerance and polyphagia.  Hematologic/Lymphatic: Negative for bleeding problem. Does not bruise/bleed easily.  Skin: Negative for flushing, nail changes, rash and suspicious lesions.  Musculoskeletal: Negative for arthritis, joint pain, muscle cramps, myalgias, neck pain and stiffness.  Gastrointestinal: Negative for abdominal pain, bowel incontinence, diarrhea and excessive appetite.  Genitourinary: Negative for decreased libido, genital sores and incomplete emptying.  Neurological: Negative for brief paralysis, focal weakness, headaches and loss of balance.  Psychiatric/Behavioral: Negative for altered mental status, depression and suicidal ideas.  Allergic/Immunologic: Negative for HIV exposure and persistent infections.    EKGs/Labs/Other Studies Reviewed:    The following studies were reviewed today:   EKG:  The ekg ordered today demonstrates   Recent Labs: No results found for requested labs within last 365 days.  Recent Lipid Panel    Component Value Date/Time   CHOL 142 01/16/2023 1346   CHOL 155 08/20/2021 0840   TRIG 208.0 (H) 01/16/2023 1346   HDL 39.60 01/16/2023 1346   HDL 35 (L) 08/20/2021 0840   CHOLHDL 4 01/16/2023 1346   VLDL 41.6 (H) 01/16/2023 1346   LDLCALC 91 08/20/2021 0840   LDLDIRECT 80.0 01/16/2023 1346    Physical Exam:    VS:  BP 132/84 (BP Location: Right Arm, Patient Position: Sitting, Cuff Size: Normal)   Pulse (!) 55   Ht 5' 2 (1.575 m)   Wt 153 lb 11.2 oz (69.7 kg)   SpO2 99%   BMI 28.11 kg/m     Wt Readings from Last 3 Encounters:  08/13/24 153 lb  11.2 oz (69.7 kg)  06/03/24 151 lb (68.5 kg)  01/12/24 161 lb (73 kg)     GEN: Well nourished, well developed in no acute distress HEENT: Normal NECK: No JVD; No carotid bruits LYMPHATICS: No lymphadenopathy CARDIAC: S1S2 noted,RRR, no murmurs, rubs, gallops RESPIRATORY:  Clear to auscultation without rales, wheezing or rhonchi  ABDOMEN: Soft, non-tender, non-distended, +bowel sounds, no guarding. EXTREMITIES: No edema, No cyanosis, no clubbing MUSCULOSKELETAL:  No deformity  SKIN: Warm and dry NEUROLOGIC:  Alert and oriented x 3, non-focal PSYCHIATRIC:  Normal affect, good insight  ASSESSMENT:    1. Encounter to establish care    PLAN:     1.  The patient is in agreement with the above plan. The patient left the office in stable condition.  The patient will follow up in   Medication Adjustments/Labs and Tests Ordered: Current medicines are reviewed at length with the patient today.  Concerns regarding medicines are outlined above.  Orders Placed This Encounter  Procedures   EKG 12-Lead   No orders of the defined types were placed in this encounter.   There are no Patient Instructions on file for this visit.   Adopting a Healthy Lifestyle.  Know what a healthy weight is for you (roughly BMI <25) and aim to maintain this   Aim for 7+ servings of fruits and vegetables daily   65-80+ fluid ounces of water or unsweet tea for healthy kidneys   Limit to max 1 drink of alcohol per day; avoid smoking/tobacco   Limit animal fats in diet for cholesterol and heart health - choose grass fed whenever available   Avoid highly processed foods, and foods high in saturated/trans fats   Aim for low stress - take time to unwind and care for your mental health   Aim for 150 min of moderate intensity exercise weekly for heart health, and weights twice weekly for bone health   Aim for 7-9 hours of sleep daily   When it comes to diets, agreement about the perfect plan isnt easy  to find, even among the experts. Experts at the [facility] of [employer] developed an idea known as the Healthy Eating Plate. Just imagine a plate divided into logical, healthy portions.   The emphasis is on diet quality:   Load up on vegetables and fruits - one-half of your plate: Aim for color and variety, and remember that potatoes dont count.   Go for whole grains - one-quarter of your plate: Whole wheat, barley, wheat berries, quinoa, oats, brown rice, and foods made with them. If you want pasta, go with whole wheat pasta.   Protein power - one-quarter of your plate: Fish, chicken, beans, and nuts are all healthy, versatile protein sources. Limit red meat.   The diet, however, does go beyond the plate, offering a few other suggestions.   Use healthy plant oils, such as olive, canola, soy, corn, sunflower and peanut. Check the labels, and avoid partially hydrogenated oil, which have unhealthy trans fats.   If youre thirsty, drink water. Coffee and tea are good in moderation, but skip sugary drinks and limit milk and dairy products to one or two daily servings.   The type of carbohydrate in the diet is more important than the amount. Some sources of carbohydrates, such as vegetables, fruits, whole grains, and beans-are healthier than others.   Finally, stay active  Signed, Dub Huntsman, DO  08/13/2024 9:32 AM    [facility]

## 2024-08-13 NOTE — Patient Instructions (Addendum)
 Medication Instructions:  Your physician recommends that you continue on your current medications as directed. Please refer to the Current Medication list given to you today.  *If you need a refill on your cardiac medications before your next appointment, please call your pharmacy*   Follow-Up: At [facility], you and your health needs are our priority.  As part of our continuing mission to provide you with exceptional heart care, our providers are all part of one team.  This team includes your primary Cardiologist (physician) and Advanced Practice Providers or APPs (Physician Assistants and Nurse Practitioners) who all work together to provide you with the care you need, when you need it.  Your next appointment:    16 weeks  Provider:   Kardie Tobb, DO

## 2024-10-12 ENCOUNTER — Other Ambulatory Visit: Payer: Self-pay | Admitting: Cardiovascular Disease

## 2024-10-14 ENCOUNTER — Other Ambulatory Visit (HOSPITAL_BASED_OUTPATIENT_CLINIC_OR_DEPARTMENT_OTHER): Payer: Self-pay

## 2024-10-14 ENCOUNTER — Other Ambulatory Visit: Payer: Self-pay | Admitting: Cardiovascular Disease

## 2024-10-14 MED ORDER — METOPROLOL TARTRATE 25 MG PO TABS
12.5000 mg | ORAL_TABLET | Freq: Two times a day (BID) | ORAL | 3 refills | Status: AC
  Filled 2024-10-14: qty 90, 90d supply, fill #0
  Filled 2025-01-06: qty 90, 90d supply, fill #1

## 2024-11-24 ENCOUNTER — Ambulatory Visit: Admitting: Cardiology
